# Patient Record
Sex: Male | Born: 1935 | Race: White | Hispanic: No | Smoking: Never smoker
Health system: Southern US, Community
[De-identification: ages and names within clinical notes are randomized; demographics above are authoritative.]

## PROBLEM LIST (undated history)

## (undated) DIAGNOSIS — N429 Disorder of prostate, unspecified: Secondary | ICD-10-CM

## (undated) DIAGNOSIS — M4800 Spinal stenosis, site unspecified: Secondary | ICD-10-CM

## (undated) DIAGNOSIS — H9190 Unspecified hearing loss, unspecified ear: Secondary | ICD-10-CM

## (undated) DIAGNOSIS — I10 Essential (primary) hypertension: Secondary | ICD-10-CM

## (undated) DIAGNOSIS — F32A Depression, unspecified: Secondary | ICD-10-CM

## (undated) DIAGNOSIS — E7800 Pure hypercholesterolemia, unspecified: Secondary | ICD-10-CM

## (undated) DIAGNOSIS — I2510 Atherosclerotic heart disease of native coronary artery without angina pectoris: Secondary | ICD-10-CM

## (undated) DIAGNOSIS — I4891 Unspecified atrial fibrillation: Secondary | ICD-10-CM

## (undated) DIAGNOSIS — N289 Disorder of kidney and ureter, unspecified: Secondary | ICD-10-CM

## (undated) DIAGNOSIS — N200 Calculus of kidney: Secondary | ICD-10-CM

## (undated) DIAGNOSIS — E079 Disorder of thyroid, unspecified: Secondary | ICD-10-CM

## (undated) DIAGNOSIS — F329 Major depressive disorder, single episode, unspecified: Secondary | ICD-10-CM

## (undated) HISTORY — PX: OTHER SURGICAL HISTORY: SHX169

## (undated) HISTORY — PX: TONSILLECTOMY: SUR1361

## (undated) HISTORY — PX: HERNIA REPAIR: SHX51

## (undated) HISTORY — PX: CARDIAC CATHETERIZATION: SHX172

## (undated) HISTORY — PX: CATARACT EXTRACTION: SUR2

## (undated) HISTORY — PX: CORONARY ARTERY BYPASS GRAFT: SHX141

## (undated) HISTORY — PX: CARPAL TUNNEL RELEASE: SHX101

## (undated) HISTORY — PX: PROSTATE SURGERY: SHX751

## (undated) HISTORY — PX: LUMBAR FUSION: SHX111

---

## 1998-06-28 ENCOUNTER — Ambulatory Visit (HOSPITAL_COMMUNITY): Admission: RE | Admit: 1998-06-28 | Discharge: 1998-06-28 | Payer: Self-pay | Admitting: *Deleted

## 2000-02-15 ENCOUNTER — Encounter: Payer: Self-pay | Admitting: Cardiovascular Disease

## 2000-02-16 ENCOUNTER — Inpatient Hospital Stay (HOSPITAL_COMMUNITY): Admission: RE | Admit: 2000-02-16 | Discharge: 2000-02-18 | Payer: Self-pay | Admitting: Cardiovascular Disease

## 2002-02-20 ENCOUNTER — Encounter: Admission: RE | Admit: 2002-02-20 | Discharge: 2002-02-20 | Payer: Self-pay | Admitting: Urology

## 2002-02-20 ENCOUNTER — Encounter: Payer: Self-pay | Admitting: Urology

## 2002-02-25 ENCOUNTER — Ambulatory Visit (HOSPITAL_BASED_OUTPATIENT_CLINIC_OR_DEPARTMENT_OTHER): Admission: RE | Admit: 2002-02-25 | Discharge: 2002-02-25 | Payer: Self-pay | Admitting: Urology

## 2002-02-25 ENCOUNTER — Encounter: Payer: Self-pay | Admitting: Urology

## 2002-02-28 ENCOUNTER — Inpatient Hospital Stay (HOSPITAL_COMMUNITY): Admission: EM | Admit: 2002-02-28 | Discharge: 2002-03-03 | Payer: Self-pay | Admitting: Emergency Medicine

## 2002-02-28 ENCOUNTER — Encounter: Payer: Self-pay | Admitting: Urology

## 2002-03-01 ENCOUNTER — Encounter: Payer: Self-pay | Admitting: Urology

## 2002-03-02 ENCOUNTER — Encounter: Payer: Self-pay | Admitting: Urology

## 2002-03-18 ENCOUNTER — Ambulatory Visit (HOSPITAL_COMMUNITY): Admission: RE | Admit: 2002-03-18 | Discharge: 2002-03-18 | Payer: Self-pay | Admitting: Urology

## 2002-03-18 ENCOUNTER — Encounter: Payer: Self-pay | Admitting: Urology

## 2002-03-23 ENCOUNTER — Encounter: Payer: Self-pay | Admitting: Urology

## 2002-03-23 ENCOUNTER — Inpatient Hospital Stay (HOSPITAL_COMMUNITY): Admission: RE | Admit: 2002-03-23 | Discharge: 2002-03-30 | Payer: Self-pay | Admitting: Urology

## 2002-03-24 ENCOUNTER — Encounter: Payer: Self-pay | Admitting: Urology

## 2002-03-24 ENCOUNTER — Encounter (INDEPENDENT_AMBULATORY_CARE_PROVIDER_SITE_OTHER): Payer: Self-pay

## 2002-06-04 ENCOUNTER — Ambulatory Visit (HOSPITAL_BASED_OUTPATIENT_CLINIC_OR_DEPARTMENT_OTHER): Admission: RE | Admit: 2002-06-04 | Discharge: 2002-06-04 | Payer: Self-pay | Admitting: Urology

## 2002-06-04 ENCOUNTER — Encounter: Payer: Self-pay | Admitting: Urology

## 2003-02-08 ENCOUNTER — Inpatient Hospital Stay (HOSPITAL_COMMUNITY): Admission: EM | Admit: 2003-02-08 | Discharge: 2003-02-20 | Payer: Self-pay | Admitting: Emergency Medicine

## 2003-02-08 ENCOUNTER — Encounter: Payer: Self-pay | Admitting: Emergency Medicine

## 2003-02-11 ENCOUNTER — Encounter: Payer: Self-pay | Admitting: Cardiothoracic Surgery

## 2003-02-12 ENCOUNTER — Encounter: Payer: Self-pay | Admitting: Cardiothoracic Surgery

## 2003-02-13 ENCOUNTER — Encounter: Payer: Self-pay | Admitting: Cardiothoracic Surgery

## 2003-02-14 ENCOUNTER — Encounter: Payer: Self-pay | Admitting: Cardiothoracic Surgery

## 2003-02-15 ENCOUNTER — Encounter: Payer: Self-pay | Admitting: Cardiothoracic Surgery

## 2004-07-17 ENCOUNTER — Ambulatory Visit (HOSPITAL_COMMUNITY): Admission: RE | Admit: 2004-07-17 | Discharge: 2004-07-17 | Payer: Self-pay | Admitting: Urology

## 2005-08-31 ENCOUNTER — Emergency Department (HOSPITAL_COMMUNITY): Admission: EM | Admit: 2005-08-31 | Discharge: 2005-09-01 | Payer: Self-pay | Admitting: Emergency Medicine

## 2005-08-31 ENCOUNTER — Emergency Department (HOSPITAL_COMMUNITY): Admission: EM | Admit: 2005-08-31 | Discharge: 2005-08-31 | Payer: Self-pay | Admitting: Emergency Medicine

## 2008-04-29 ENCOUNTER — Encounter: Admission: RE | Admit: 2008-04-29 | Discharge: 2008-04-29 | Payer: Self-pay | Admitting: Neurological Surgery

## 2008-06-24 ENCOUNTER — Inpatient Hospital Stay (HOSPITAL_COMMUNITY): Admission: RE | Admit: 2008-06-24 | Discharge: 2008-07-02 | Payer: Self-pay | Admitting: Neurological Surgery

## 2008-07-20 ENCOUNTER — Encounter: Admission: RE | Admit: 2008-07-20 | Discharge: 2008-07-20 | Payer: Self-pay | Admitting: Neurological Surgery

## 2008-07-26 ENCOUNTER — Encounter: Admission: RE | Admit: 2008-07-26 | Discharge: 2008-07-26 | Payer: Self-pay | Admitting: Neurological Surgery

## 2008-08-23 ENCOUNTER — Encounter: Admission: RE | Admit: 2008-08-23 | Discharge: 2008-08-23 | Payer: Self-pay | Admitting: Neurological Surgery

## 2008-10-25 ENCOUNTER — Encounter: Admission: RE | Admit: 2008-10-25 | Discharge: 2008-10-25 | Payer: Self-pay | Admitting: Neurological Surgery

## 2009-02-07 ENCOUNTER — Encounter: Admission: RE | Admit: 2009-02-07 | Discharge: 2009-02-07 | Payer: Self-pay | Admitting: Neurological Surgery

## 2009-05-19 ENCOUNTER — Ambulatory Visit (HOSPITAL_COMMUNITY): Admission: RE | Admit: 2009-05-19 | Discharge: 2009-05-19 | Payer: Self-pay | Admitting: Cardiovascular Disease

## 2009-11-06 IMAGING — RF DG LUMBAR SPINE 2-3V
1 series · 2 of 2 positions shown · non-contrast
Comparison: Lumbar spine CT 04/29/2008.

CLINICAL DATA: Bilateral L3-L4, L4-L5 PLIF.

LUMBAR SPINE - 2-3 VIEW

[Series 1: run · 2 of 2 slices shown]
[im 1/2]
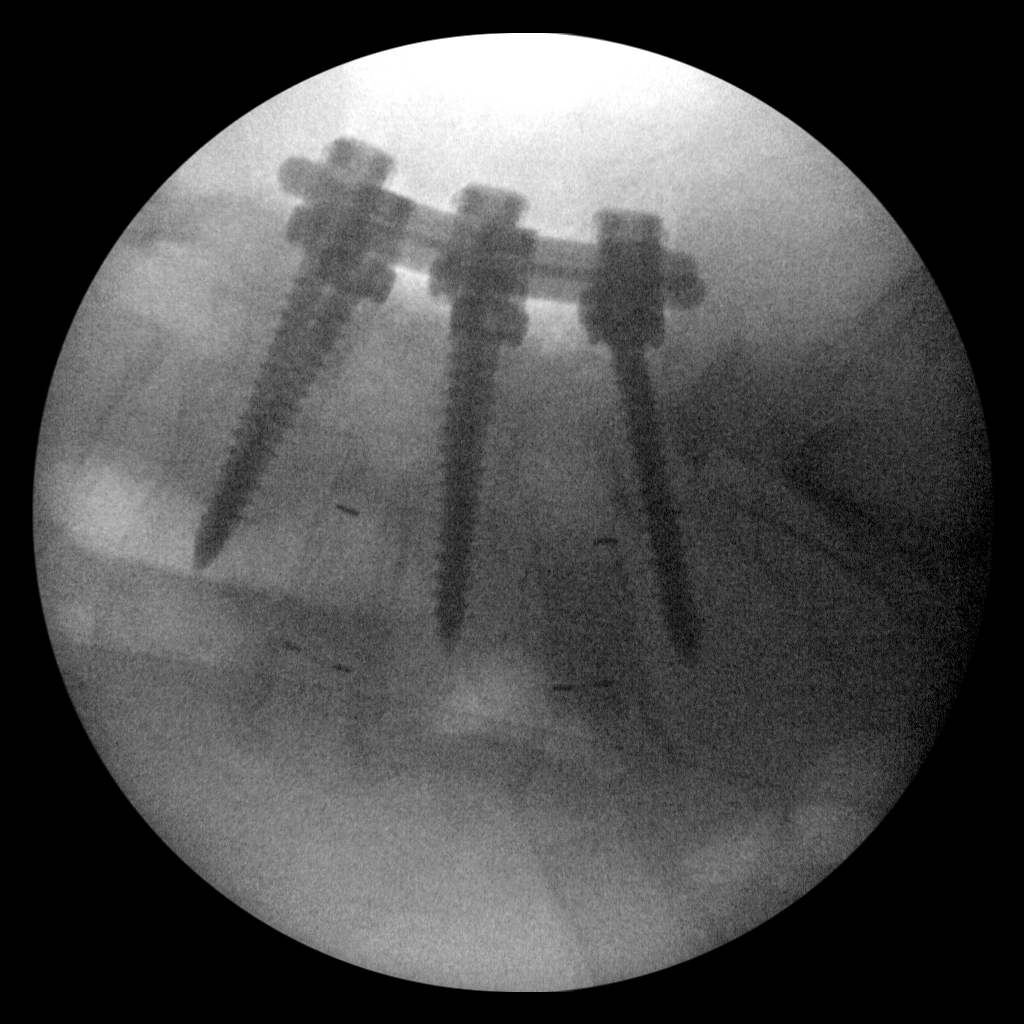
[im 2/2]
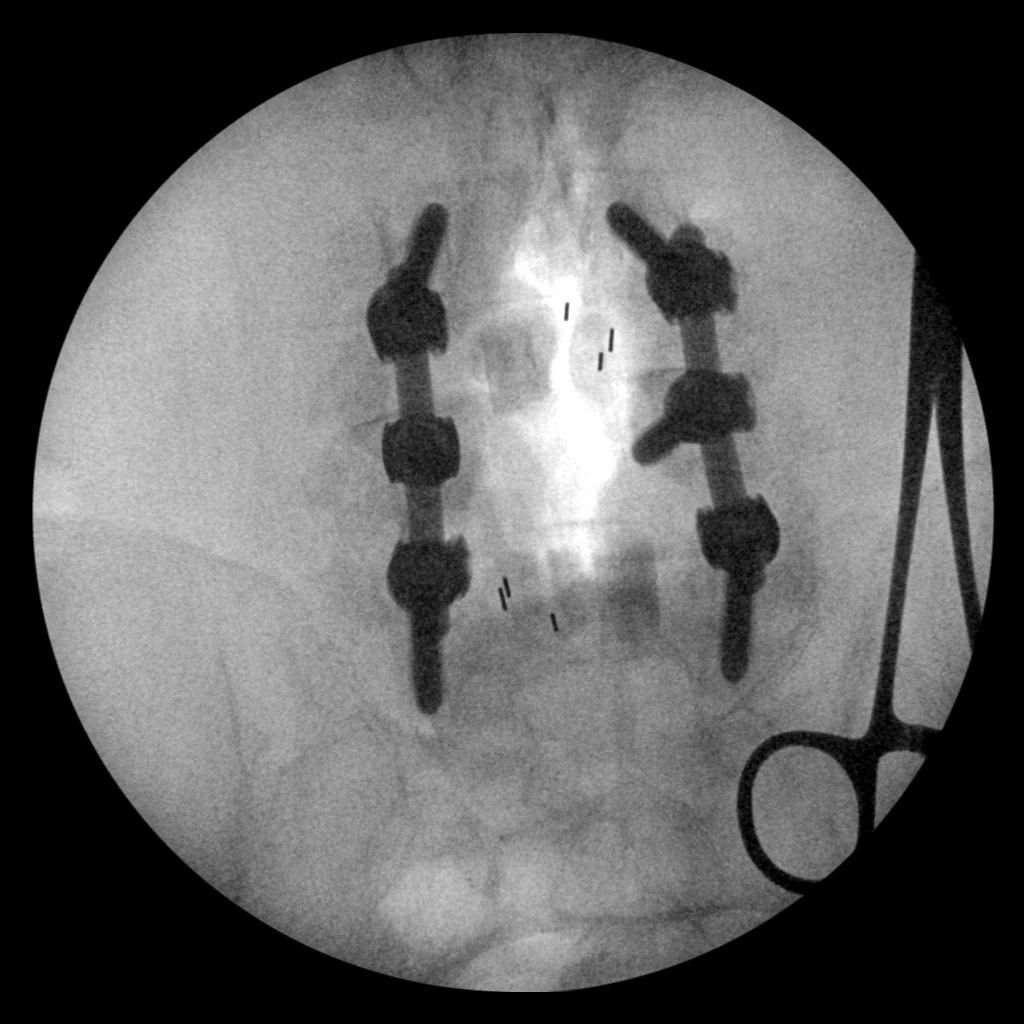

[2 of 2 positions shown; findings below may reference images not displayed]

FINDINGS: Spot fluoroscopic PA and lateral images are submitted
postoperatively from the operating room.  These demonstrate
bilateral laminectomies and posterior lumbar interbody fusion from
L3-L5.  Bilateral pedicle screws, interconnecting rods and
intervertebral bone plugs appear well positioned.
IMPRESSION: Intraoperative views during L3-L5 PLIF without demonstrated
complication.

## 2009-11-10 IMAGING — CR DG LUMBAR SPINE 2-3V
2 series · 2 of 2 positions shown · non-contrast
Comparison: 06/24/2008

CLINICAL DATA: Spinal stenosis

LUMBAR SPINE TWO VIEW

[t l-spine a.p.]
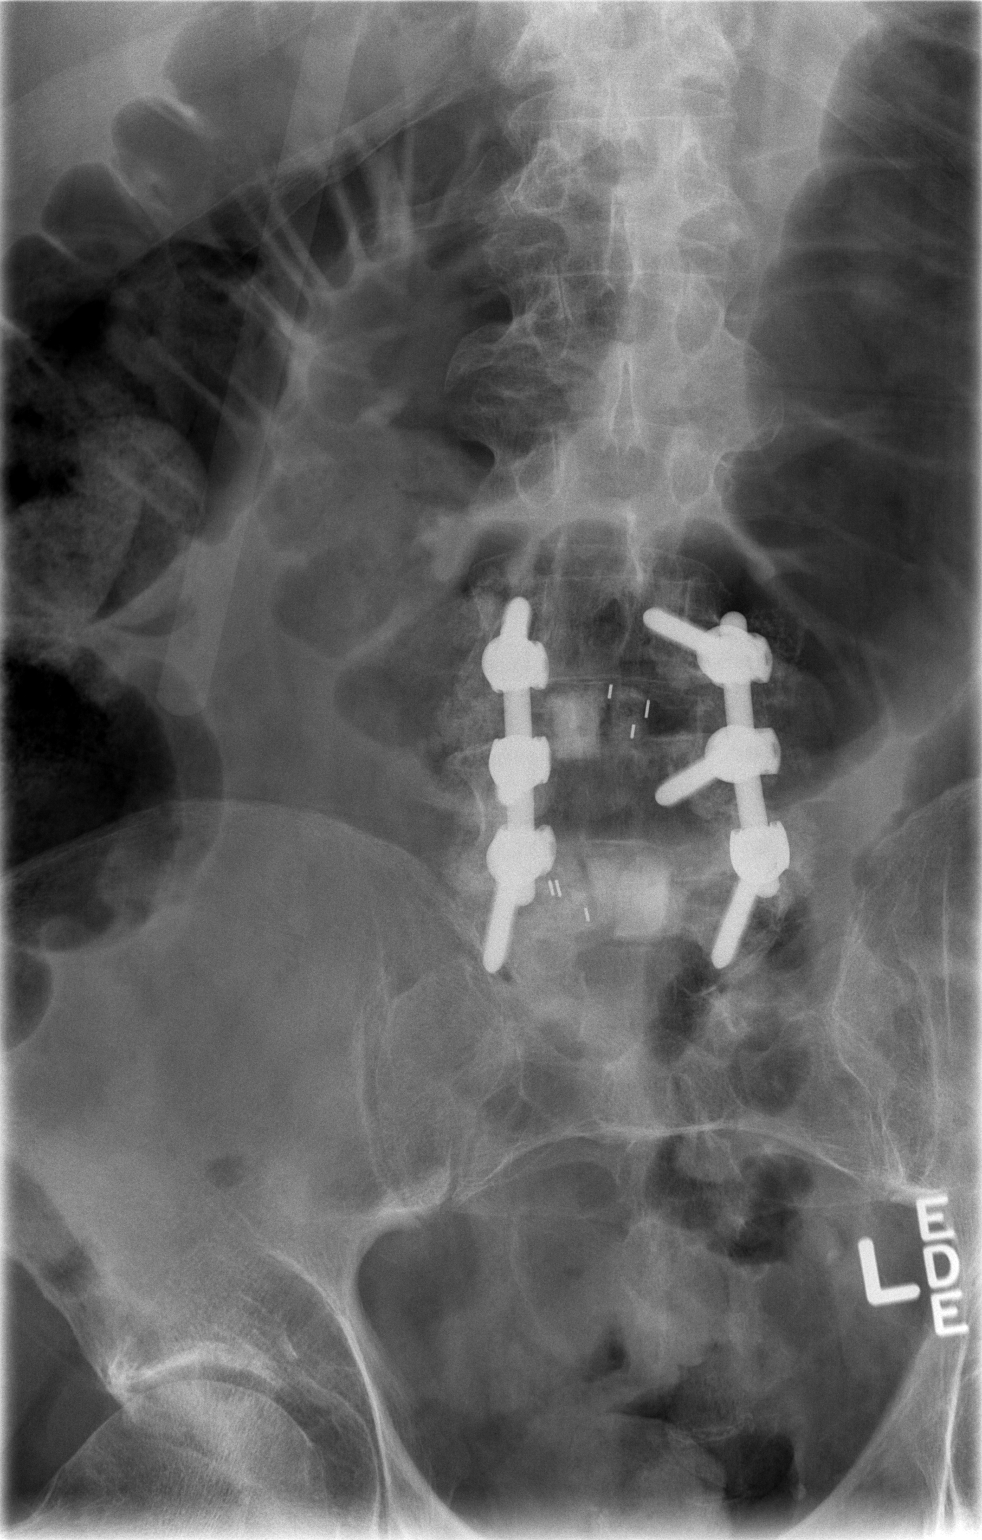

[t l-spine lat]
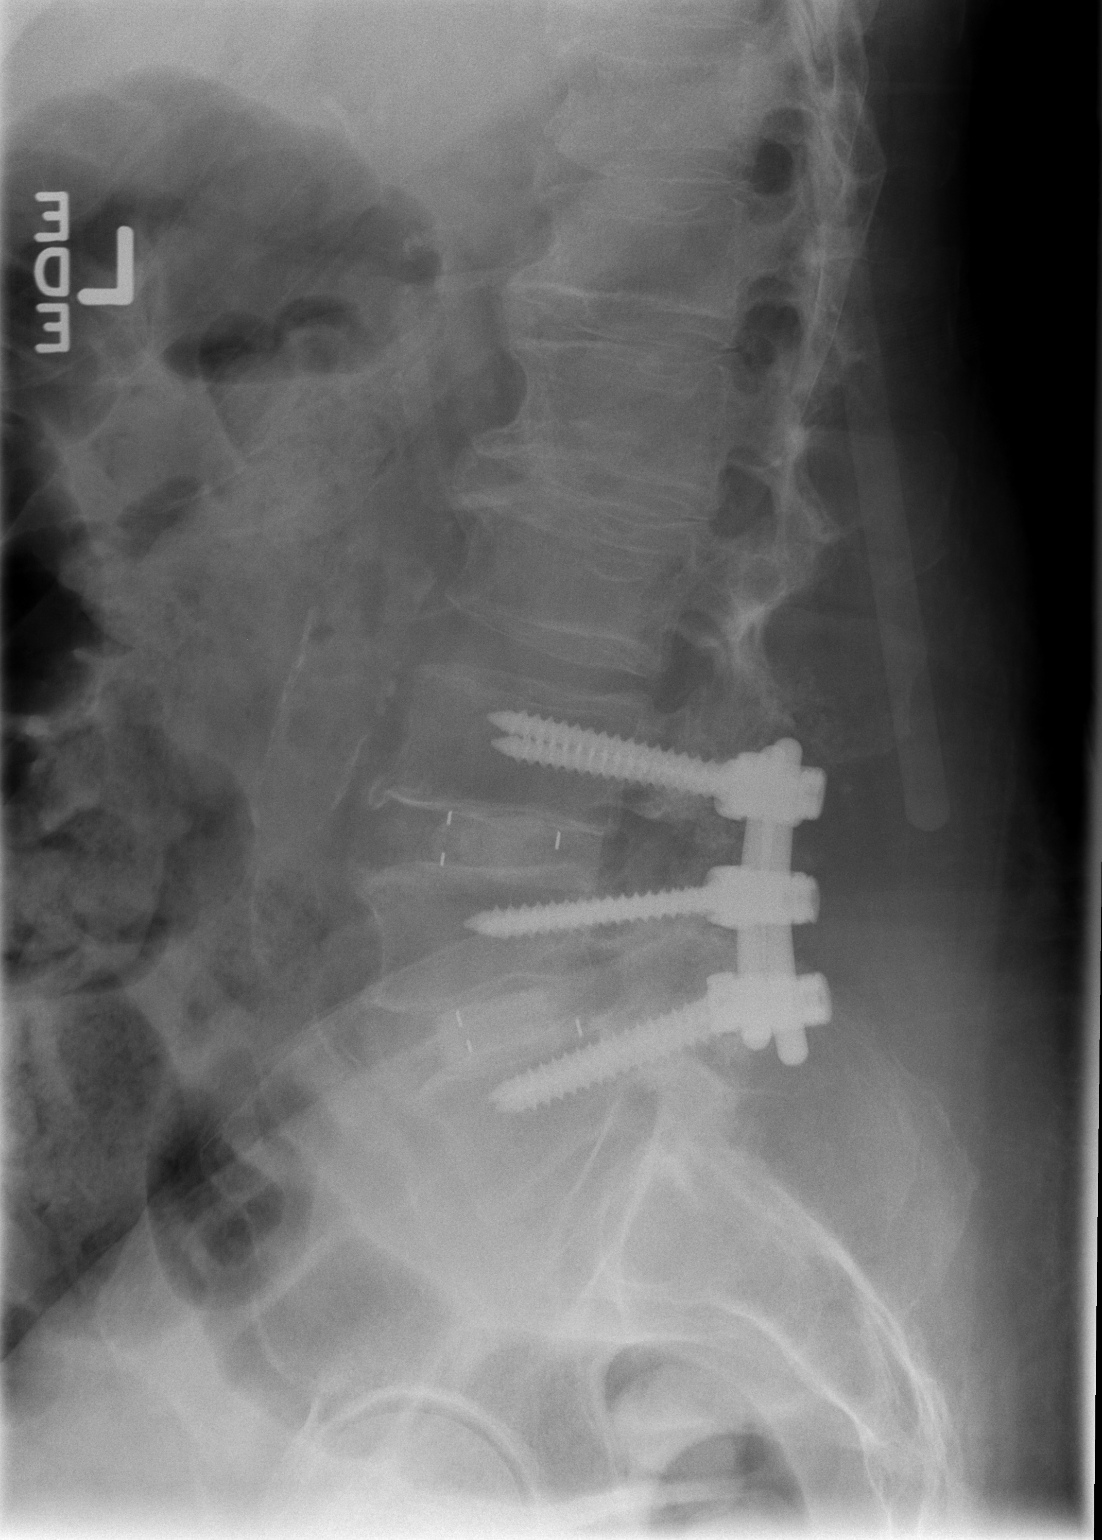

[2 of 2 positions shown; findings below may reference images not displayed]

FINDINGS: Changes of instrumented posterolateral and interbody
fusion L3-L5.  Bilateral pedicle screws at each level with
interconnecting vertical rods remain well positioned with no
surrounding lucency or fracture.  Graft material and markers in the
intervening interspaces are stable.  Prominent anterior endplate
spurring is noted T10-L2.  Atheromatous thoracic aorta.
IMPRESSION: Stable changes of PLIF L3-L5

## 2009-11-13 IMAGING — CR DG CHEST 2V
2 series · 2 of 2 positions shown · non-contrast
Comparison: PA and lateral chest 06/21/08 and [REDACTED].

CLINICAL DATA: Postoperative fever.

CHEST - 2 VIEW

[w chest pa]
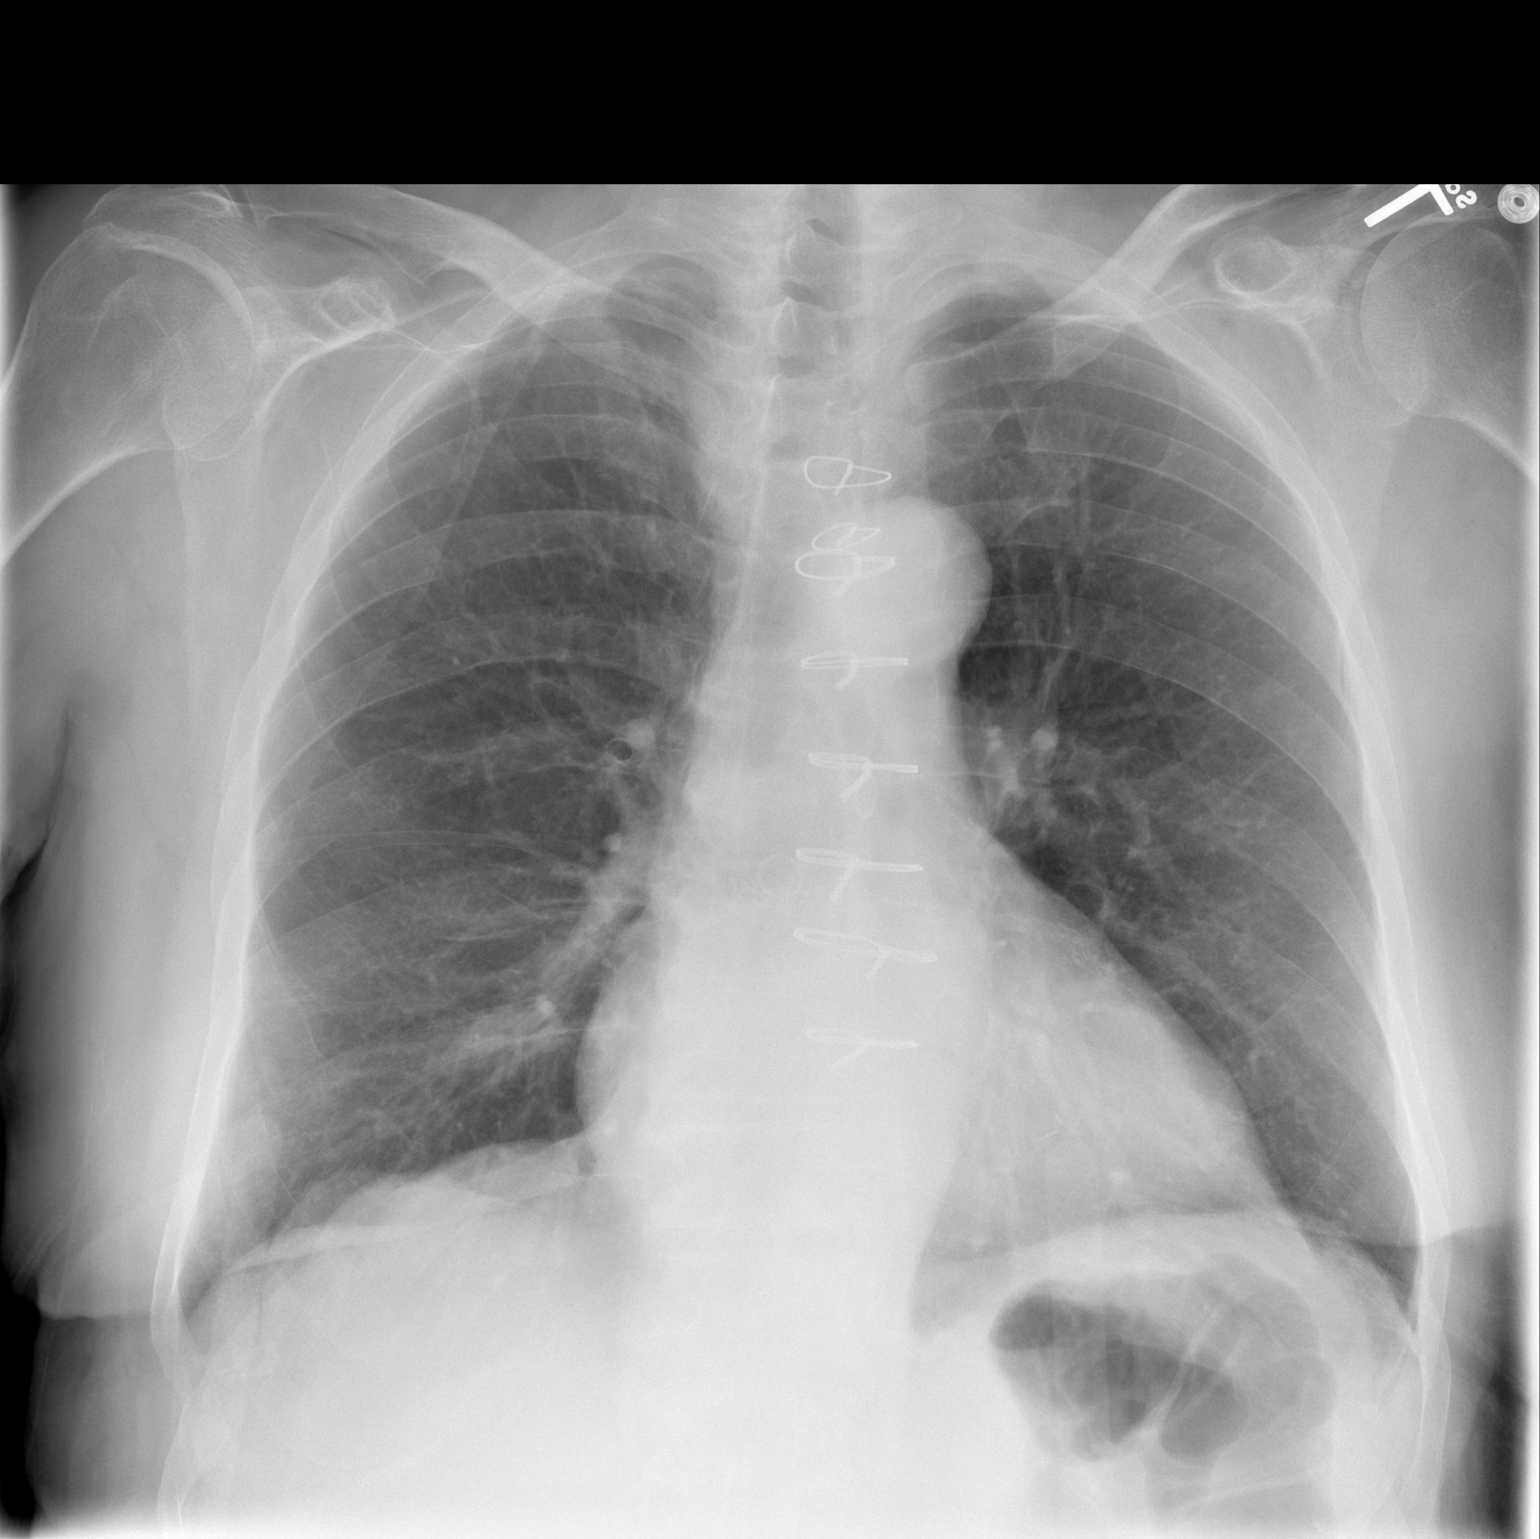

[w chest lat]
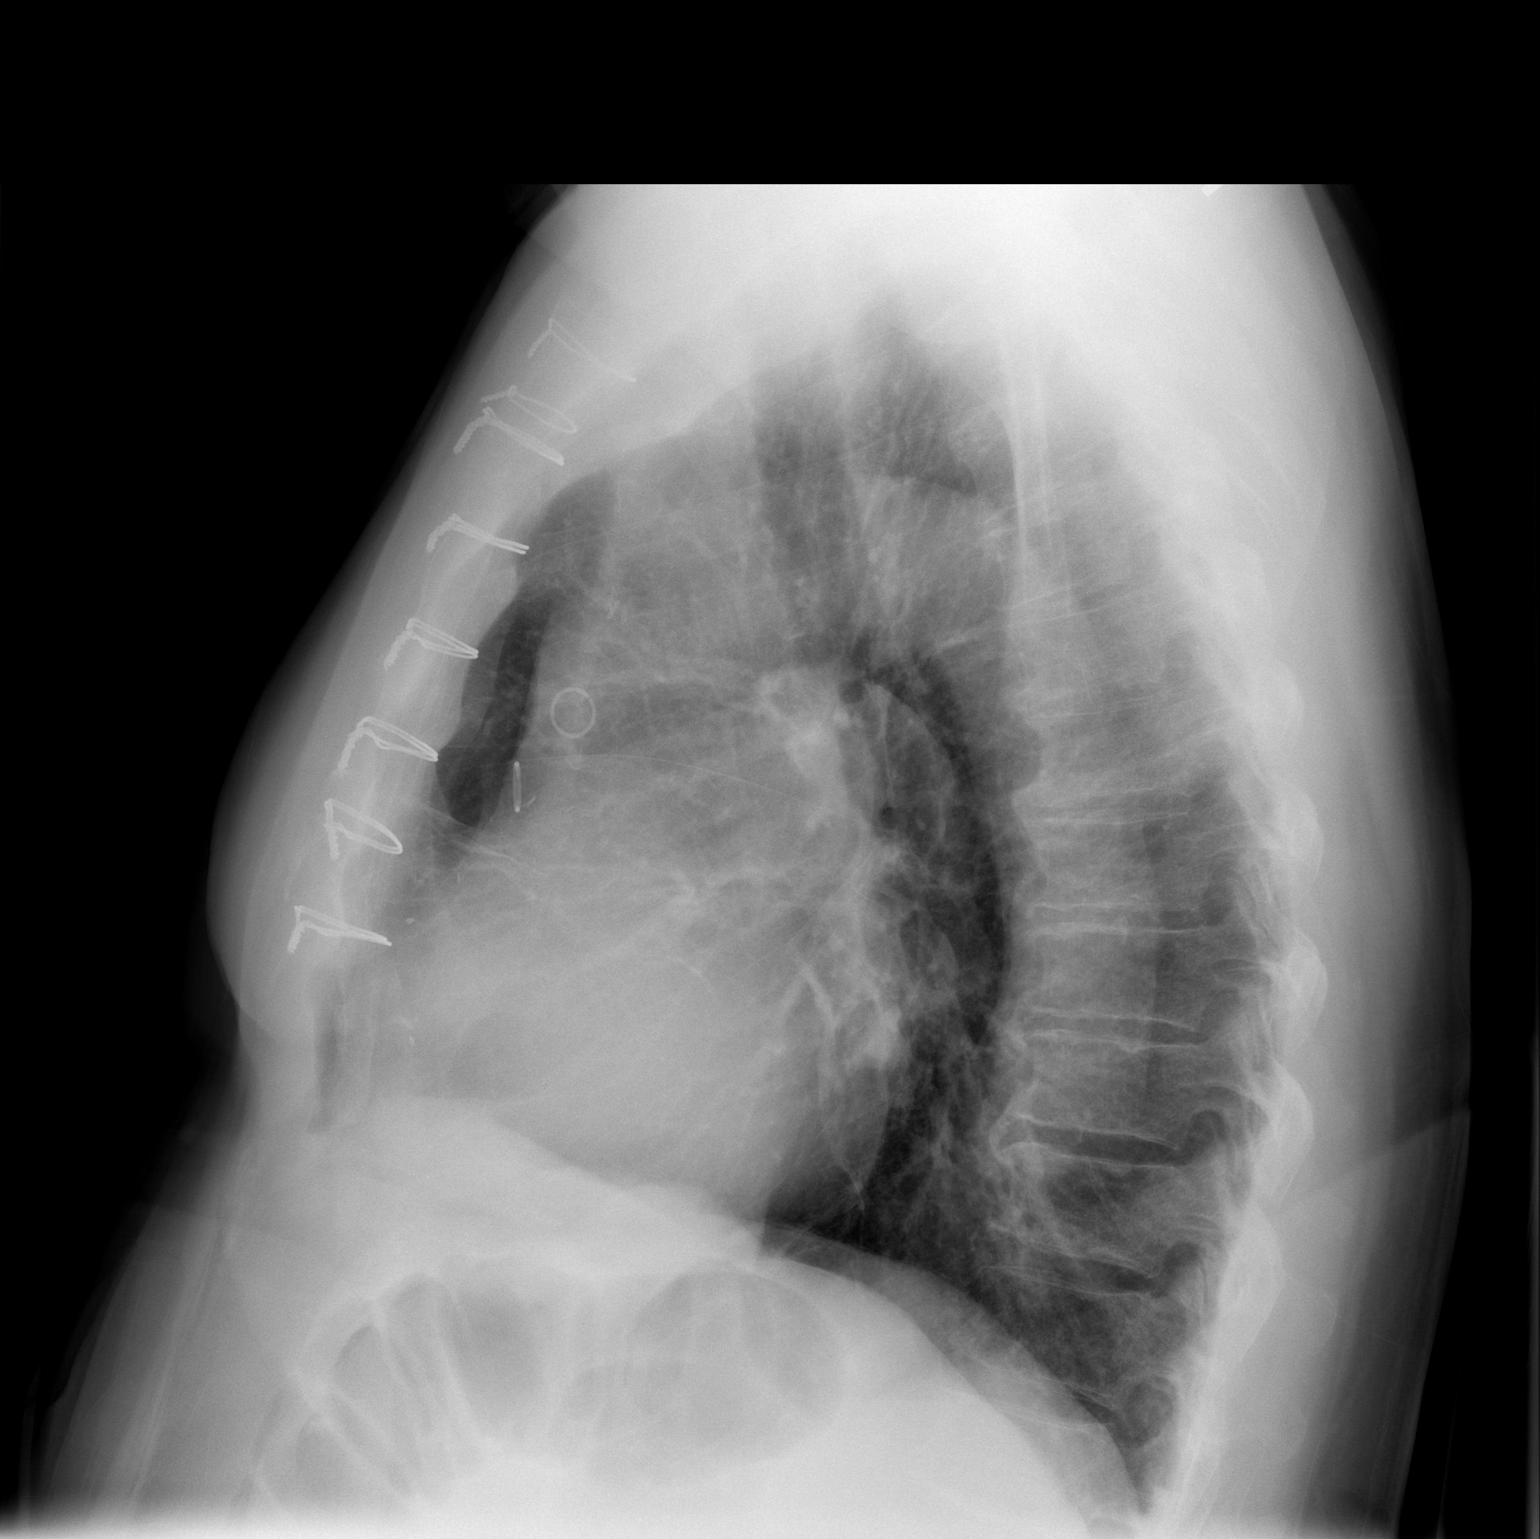

[2 of 2 positions shown; findings below may reference images not displayed]

FINDINGS: Lungs are clear.  There is cardiomegaly.  No pleural
effusion or focal bony abnormality.
IMPRESSION: Negative for pneumonia.  Cardiomegaly noted.

## 2010-09-19 LAB — CBC
HCT: 45 % (ref 39.0–52.0)
Platelets: 140 10*3/uL — ABNORMAL LOW (ref 150–400)
RBC: 4.48 MIL/uL (ref 4.22–5.81)
WBC: 6.2 10*3/uL (ref 4.0–10.5)

## 2010-09-19 LAB — BASIC METABOLIC PANEL
CO2: 29 mEq/L (ref 19–32)
Chloride: 103 mEq/L (ref 96–112)
GFR calc non Af Amer: 54 mL/min — ABNORMAL LOW (ref >60)
Potassium: 4.4 mEq/L (ref 3.5–5.1)

## 2010-09-19 LAB — TSH: TSH: 4.526 u[IU]/mL — ABNORMAL HIGH (ref 0.350–4.500)

## 2010-09-19 LAB — PROTIME-INR: INR: 1.84 — ABNORMAL HIGH (ref 0.00–1.49)

## 2010-09-19 LAB — APTT: aPTT: 32 seconds (ref 24–37)

## 2010-09-19 LAB — SEDIMENTATION RATE: Sed Rate: 2 mm/hr (ref 0–16)

## 2010-10-02 LAB — URINE MICROSCOPIC-ADD ON

## 2010-10-02 LAB — PROTIME-INR
INR: 1.1 (ref 0.00–1.49)
INR: 1.2 (ref 0.00–1.49)
INR: 1.3 (ref 0.00–1.49)
INR: 1.3 (ref 0.00–1.49)
Prothrombin Time: 15.9 seconds — ABNORMAL HIGH (ref 11.6–15.2)
Prothrombin Time: 16.7 seconds — ABNORMAL HIGH (ref 11.6–15.2)

## 2010-10-02 LAB — CBC
HCT: 41.9 % (ref 39.0–52.0)
Hemoglobin: 11.2 g/dL — ABNORMAL LOW (ref 13.0–17.0)
Hemoglobin: 11.5 g/dL — ABNORMAL LOW (ref 13.0–17.0)
Hemoglobin: 11.9 g/dL — ABNORMAL LOW (ref 13.0–17.0)
Hemoglobin: 14.5 g/dL (ref 13.0–17.0)
MCHC: 34 g/dL (ref 30.0–36.0)
MCHC: 34.5 g/dL (ref 30.0–36.0)
MCV: 95.6 fL (ref 78.0–100.0)
MCV: 96.2 fL (ref 78.0–100.0)
MCV: 96.7 fL (ref 78.0–100.0)
Platelets: 145 10*3/uL — ABNORMAL LOW (ref 150–400)
RBC: 3.5 MIL/uL — ABNORMAL LOW (ref 4.22–5.81)
RBC: 3.65 MIL/uL — ABNORMAL LOW (ref 4.22–5.81)
RBC: 3.72 MIL/uL — ABNORMAL LOW (ref 4.22–5.81)
RBC: 4.38 MIL/uL (ref 4.22–5.81)
RDW: 13.3 % (ref 11.5–15.5)
WBC: 11.7 10*3/uL — ABNORMAL HIGH (ref 4.0–10.5)
WBC: 7.4 10*3/uL (ref 4.0–10.5)
WBC: 8.6 10*3/uL (ref 4.0–10.5)

## 2010-10-02 LAB — TYPE AND SCREEN: Antibody Screen: NEGATIVE

## 2010-10-02 LAB — DIFFERENTIAL
Eosinophils Absolute: 0.2 10*3/uL (ref 0.0–0.7)
Eosinophils Relative: 2 % (ref 0–5)
Lymphs Abs: 1.2 10*3/uL (ref 0.7–4.0)
Monocytes Absolute: 1 10*3/uL (ref 0.1–1.0)
Monocytes Relative: 14 % — ABNORMAL HIGH (ref 3–12)
Neutrophils Relative %: 68 % (ref 43–77)

## 2010-10-02 LAB — BASIC METABOLIC PANEL
Calcium: 8.5 mg/dL (ref 8.4–10.5)
Chloride: 100 mEq/L (ref 96–112)
Chloride: 100 mEq/L (ref 96–112)
Creatinine, Ser: 0.95 mg/dL (ref 0.4–1.5)
Creatinine, Ser: 1.16 mg/dL (ref 0.4–1.5)
GFR calc Af Amer: >60 mL/min (ref >60)
GFR calc Af Amer: >60 mL/min (ref >60)
GFR calc Af Amer: >60 mL/min (ref >60)
GFR calc non Af Amer: >60 mL/min (ref >60)
Potassium: 3.7 mEq/L (ref 3.5–5.1)
Potassium: 4.5 mEq/L (ref 3.5–5.1)

## 2010-10-02 LAB — URINALYSIS, ROUTINE W REFLEX MICROSCOPIC
Glucose, UA: NEGATIVE mg/dL
Specific Gravity, Urine: 1.028 (ref 1.005–1.030)
pH: 7 (ref 5.0–8.0)

## 2010-10-02 LAB — ABO/RH: ABO/RH(D): O NEG

## 2010-10-02 LAB — URINE CULTURE: Culture: NO GROWTH

## 2010-10-31 NOTE — Consult Note (Signed)
Kirk Miller, Kirk Miller NO.:  [id number]   MEDICAL RECORD NO.:  [id number]          PATIENT TYPE:  INP   LOCATION:  3028                         FACILITY:  [facility]   PHYSICIAN:  Maretta Bees. Vonita Moss, M.D.DATE OF BIRTH:  1924/01/19   DATE OF CONSULTATION:  06/28/2008  DATE OF DISCHARGE:                                 CONSULTATION   I was asked to see this gentleman by Dr. Gerlene Fee because he had recent  back surgery and Foley catheter was put at the time of surgery.  It was  removed and then he voided satisfactorily for a while, but then  developed increased trouble voiding.  A bladder scan showed over 1000 mL  in the bladder and nursing staff could not catheterize him.   He is a patient of Dr. Vic Blackbird and said that there sometimes  have been troubles catheterizing him before.  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  He said ordinarily, he voids well  per routine office visits with Dr. Aldean Ast.   This gentleman's medical problems include:  1. Atrial fibrillation.  2. Hypercholesterolemia.  3. Gastroesophageal reflux.  4. Hypothyroidism.   He also is on Proscar for his prostate.   He is allergic to SULFA, PENICILLIN, and MOBIC.   PHYSICAL EXAMINATION:  GENERAL:  On examination, he is alert and  oriented in minimal distress.  ABDOMEN:  Soft and nontender except for suprapubic fullness.  GU:  Penis, urethral meatus, scrotum, testicles, and epididymis without  lesions.   Foley catheter was inserted without particular difficulty and several  hundred mL of clear urine obtained per Foley.   IMPRESSION:  1. Bladder outlet obstruction.  2. Urinary retention.  3. History of stones.   PLAN:  Leave Foley catheter in place and continue on Proscar and I have  started him on Flomax and Dr. Aldean Ast will see him in followup  regarding timing of a voiding trial.      Maretta Bees. Vonita Moss, M.D.  Electronically Signed     LJP/MEDQ   D:  06/28/2008  T:  06/28/2008  Job:  [id number]   cc:   Reinaldo Meeker, M.D.

## 2010-10-31 NOTE — Discharge Summary (Signed)
NAMEJUSTAN, Kirk Miller NO.:  [id number]   MEDICAL RECORD NO.:  [id number]          PATIENT TYPE:  INP   LOCATION:  3028                         FACILITY:  [facility]   PHYSICIAN:  Tia Alert, MD     DATE OF BIRTH:  1923-11-29   DATE OF ADMISSION:  06/24/2008  DATE OF DISCHARGE:  07/02/2008                               DISCHARGE SUMMARY   ADMISSION DIAGNOSIS:  Lumbar spinal stenosis.   PROCEDURE:  Decompression lumbar laminectomy with instrumented fusion L3-  L5.   HISTORY OF PRESENT ILLNESS:  Mr. Fier is an 77 year old gentleman who  presented with severe back and leg pain.  He had an MRI which showed  severe stenosis at L3-L4, L4-L5 and recommended a decompression  instrumented fusion from L3-L5.  He understood the risks, benefits,  expected outcome and wished to proceed.   HOSPITAL COURSE:  The patient was admitted on June 24, 2008 and taken  to operating room where he underwent a lumbar decompression instrumented  fusion L3-L5.  He tolerated the procedure well.  He was taken to  recovery room and then to the neurosurgical ICU in stable condition.  He  did quite well here.  He was started on Lovenox.  He was on Coumadin  prior to the surgery.  This was resumed on postop day 2.  He was moved  out of the ICU on postop day 1 where he mobilized fairly well with  physical and occupational therapy.  On postop day 3, he had some urinary  retention and had a Foley catheter placed.  Urinalysis did not show  urinary tract infection.  He had some increase in his pain at that time.  Plain films looked quite good.  He continued on his Coumadin as per the  pharmacy.  He remained afebrile with stable vital signs.  His incision  remained clean, dry and intact.  He had weakness in his dorsiflexors on  the left, but that was old.  He then started to improve with physical  and occupational therapy, and was more mobile.  We felt he was stable  for discharge to a skilled  nursing facility and he was discharged to  [facility] in [place] with plans to follow up with  me 1 week after discharge from that facility.   DISCHARGE MEDICATIONS:  1. Colace 100 mg p.o. b.i.d.  2. Levothyroxine 137 mcg p.o. daily.  3. Protonix 40 mg p.o. daily.  4. Amiodarone 200 mg p.o. daily.  5. Aspirin 81 mg p.o. daily.  6. Calcium carbonate 500 mg p.o. b.i.d.  7. Toprol XL 25 mg p.o. daily.  8. Proscar 5 mg n.p.o. daily.  9. Lexapro 10 mg p.o. daily.  10.Zocor 40 mg p.o. daily.  11.Coumadin 5 mg p.o. daily.  12.Flomax 0.4 mg p.o. daily.  13.Percocet 1-2 pills p.o. q.6 h. p.r.n. pain.  14.Flexeril 10 mg p.o. q.8 h. p.r.n. spasm.   PLAN:  1. The patient's primary care physician, Dr. Aldean Ast would like to      be responsible for removal of the Foley  catheter followed by a voiding trial according to his note in the      chart.  2. I would like to see the patient back in followup 1 week after      discharge from the skilled nursing facility.   FINAL DIAGNOSIS:  Post lumbar interbody fusion L3-L4, L4-L5.      Tia Alert, MD  Electronically Signed     DSJ/MEDQ  D:  07/02/2008  T:  07/02/2008  Job:  [id number]

## 2010-11-03 NOTE — H&P (Signed)
NAMELI, FRAGOSO NO.:  [id number]   MEDICAL RECORD NO.:  [id number]                    PATIENT TYPE:   LOCATION:                                       FACILITY:   PHYSICIAN:  Jamison Neighbor, M.D.               DATE OF BIRTH:   DATE OF ADMISSION:  DATE OF DISCHARGE:                                HISTORY & PHYSICAL   ADMISSION DIAGNOSIS:  Obstructive right kidney.   HISTORY OF PRESENT ILLNESS:  This 77 year old male has obstruction of the  right kidney, felt to be due to ureteral scarring secondary to a urethral  lithotomy done back in the 1970's. The patient developed increasing flank  pain to the point where he was taken to the OR and attempt at double J  catheter placement was made. Dr. Aldean Ast was able to get a wire through  the strictured area but apparently could not get a balloon dilator or Stent  through that area. This procedure was done this past Wednesday. On Thursday  of last week, the patient had an IVP performed in Dr. Valda Lamb office  which apparently also showed obstruction. The patient and his wife relate  that they were told that a percutaneous nephrostomy or other form of  diversion might be required but they were unclear as to when this would be  scheduled. The patient developed increasing pain to the point where he  presented to the emergency room in the early hours of Saturday morning. He  was brought in on Saturday for 23 hour observation, so that a percutaneous  nephrostomy tube could be placed. The patient's pain was reasonably well  controlled with a PCA pump. Radiology, however, could not get to the  nephrostomy tube placement until Sunday and apparently the patient is being  converted from 23 hour observation status to admission status.   PAST MEDICAL HISTORY:  Remarkable for hyperthyroidism, coronary artery  disease.   CURRENT MEDICATIONS:  Altace 5 mg daily, Lipitor 10 mg daily, Synthroid 50  mcg daily, and  he takes Celebrex one daily, although that has been placed on  hold. The patient also was recently given Roxicet for pain and Levaquin  following his recent cystoscopic procedure.   PAST SURGICAL HISTORY:  Kidney stone surgery in 1977 with wound infection  complications. He has had hernia repairs times two. He has had PTCA and  Stent placement in 2001 by Dr. [name]. The patient does not use tobacco  or alcohol.   REVIEW OF SYSTEMS:  Unremarkable other than as described above.   PHYSICAL EXAMINATION:  GENERAL: Very friendly male who does have severe  right sided flank pain.  VITAL SIGNS: Temperature 97.7, pulse 60, respiratory rate 18, blood pressure  172/95. This did decrease once the pain was placed under control.  HEENT: Normocephalic and atraumatic. Cranial nerves 2-12 grossly intact.  NECK: Supple. No adenopathy or thyromegaly.  LUNGS: Clear.  HEART: Regular rate and rhythm.  No murmur, rub, or gallop.  ABDOMEN: Soft and nontender with no palpable masses, rebound, or guarding.  No hepatosplenomegaly detected. No flank mass or tenderness.  EXTREMITIES: No clubbing, cyanosis, or edema.  RECTAL: Examination not performed.    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   PLAN:  Admit in preparation for percutaneous nephrostomy tube placement.                                               Jamison Neighbor, M.D.    RJE/MEDQ  D:  03/01/2002  T:  03/01/2002  Job:  [id number]   cc:   Rozanna Boer., M.D.   Lucila Maine, M.D.

## 2010-11-03 NOTE — Cardiovascular Report (Signed)
Kirk Miller Miller, Kirk Miller NO.:  [id number]   MEDICAL RECORD NO.:  [id number]                   PATIENT TYPE:  INP   LOCATION:  3737                                 FACILITY:  [facility]   PHYSICIAN:  Darlin Priestly, M.D.             DATE OF BIRTH:  1924-06-06   DATE OF PROCEDURE:  02/09/2003  DATE OF DISCHARGE:                              CARDIAC CATHETERIZATION   PROCEDURES:  1. Left heart catheterization.  2. Coronary angiography.  3. Left ventriculogram.  4. Abdominal aortogram.   ATTENDING PHYSICIAN:  Darlin Priestly, M.D.   COMPLICATIONS:  None.   INDICATIONS:  Kirk Miller Miller is a 77 year old male patient of Dr. Pearletha Furl.  Kirk Miller Miller and Dr. Aldean Ast with history of hypertension, hyperlipidemia,  history of coronary artery disease status post percutaneous transluminal  coronary angioplasty and stenting of his LAD in August 2001.  The patient  was admitted on February 08, 2003 with substernal chest pain radiating to his  shoulder and diaphoresis.  He is now referred for repeat catheterization to  rule out significant coronary artery disease.   DESCRIPTION OF OPERATION:  After obtaining informed written consent, the  patient was brought to the cardiac catheterization laboratory.  Right groin  was shaved and prepped and draped in the usual sterile fashion.  ECG monitor  was established.  Using the modified Seldinger technique, a 6 French  arterial sheath was inserted in the right femoral artery. A 6 French  diagnostic catheter was then used to performed diagnostic angiography.  This  reveals a large left main with no significant disease.  The LAD is a large  vessel that coursed to the apex and gives rise to three diagonal branches.  The LAD has noted 40% ostial narrowing.  There is a stent noted in the  proximal and mid LAD extending across two diagonal branches.  There is  diffuse 10-20% in-stent restenosis.  The mid LAD is noted to have a 60%  lucent area at the take-off of the third diagonal.  There is 50% distal LAD  lesion.   First, second and third diagonals are all small vessels with ostial disease.   Left circumflex was a large vessel that coursed through the AV groove and  gave rise to two obtuse marginal branches.  The AV groove circ is noted to  have a 70% lesion at the take-off at the large second obtuse marginal.  The  first OM is a large vessel that bifurcates distally and has an 80% early mid  vessel lesion.  The second OM is a large vessel with no significant disease.   The right coronary artery is a medium size vessel which is dominant and  gives rise to both PDA as well as posterior lateral branch.  The RCA is  noted to have a 70% lucent area in its mid segment.  The PDA is a medium  size vessel with  95% ostial and proximal  lesion.  The PLA is a medium size  vessel with 80% ostial lesion.   LEFT VENTRICULOGRAM:  Left ventriculogram reveals mildly depressed EF at 40-  45% with inferoapical and apical hypokinesis.   ABDOMINAL AORTOGRAM:  Reveals mildly dilated aorta just above the renals.  There does not appear to be any significant renal artery stenosis.   HEMODYNAMICS:  Systemic arterial pressure 119/83, LV pressure 120/10, LVEDP  of 12.   CONCLUSIONS:  1. Significant three-vessel coronary artery disease.  2. Mildly depressed left ventricular systolic function.  3. No evidence of significant abdominal aneurysm.                                                   Darlin Priestly, M.D.    RHM/MEDQ  D:  02/09/2003  T:  02/09/2003  Job:  [id number]   cc:   Rozanna Boer., M.D.  [address]  [place]  [place] [address]  Fax: [phone]

## 2010-11-03 NOTE — Discharge Summary (Signed)
NAMESEYDOU, HEARNS NO.:  [id number]   MEDICAL RECORD NO.:  [id number]                   PATIENT TYPE:  INP   LOCATION:  0348                                 FACILITY:  [facility]   PHYSICIAN:  Rozanna Boer., M.D.      DATE OF BIRTH:  1923/10/21   DATE OF ADMISSION:  03/23/2002  DATE OF DISCHARGE:  03/30/2002                                 DISCHARGE SUMMARY   DISCHARGE DIAGNOSES:  1. Right proximal ureteral stricture.  2. Hydronephrosis.  3. Left renal stone.  4. Status post cardiac stent.   PROCEDURE:  1. Attempted antegrade ureteral stricture dilation on 03/23/02.  2. Right ureteral pyelostomy on 03/24/02.   HISTORY OF PRESENT ILLNESS:  This 77 year old patient is admitted with a  right proximal ureteral stricture from an old ureteral lithotomy in 1977,  with postoperative infection.  He had acute flank pain on 02/17/02, eventually  revealed the stricture.  He had a guide wire through the stricture, but I  could not advance a #6 ureteral catheter over this on 02/25/02.  He was  admitted for a percutaneous nephrostomy on 03/01/02, and attempted antegrade  dilation was not successful on 03/02/02.  He is re-admitted at this time for  another attempt at antegrade dilation on the day of admission, but will have  open exploration the following day if unsuccessful.  He did have equal  function on renal scan on 03/18/02, and has a 7.7 x 5 mm stone in the left  mid pole kidney since 1989, so a nephrectomy would be possible, but a very  undesirable option.  He is admitted on Altace, Isosorbide, Levaquin,  Lipitor, and Synthroid.  He has been off his aspirin and Plavix for at least  a week.   ALLERGIES:  1. PENICILLIN.  2. SULFA.   LABORATORY DATA:  Admission hematocrit of 44%, white count 5900, creatinine  was 1.1, and remained stable throughout.   HOSPITAL COURSE:  He was taken to the operating room the following day after  admission after  unsuccessful antegrade attempt at dilation through his  percutaneous nephrostomy.  At exploration he had a very dense scar  encompassing the stricture and also a kink at the ureteropelvic junction.  He ended up having a segment of about 2 inches taken out, and then the  kidney was immobilized and packed so that it could come together with the  ureteral pyelostomy.  This was done over a stent.  Intraoperatively, he  developed atrial fibrillation which was new.  His pressure dropped when his  rate was over 137, so a Cardizem drip had to be started.  This was  maintained postoperatively for a while.  He was put in the ICU, and then  seen by cardiology.  Dr. Kandis Cocking group felt that he ought to be  anticoagulated, so he was put on heparin protocol for the first night  postoperatively.  He had a drain and did  not seem to drain.  His hematocrit  remained stable, but within about four or five hours postoperatively, his  atrial fibrillation converted back to normal sinus rhythm.  His hematocrit  remained stable in the mid to low 30's, and his white count was also normal.  He was transferred out of the unit up to the floor, and his JP drainage  continued for a while.  He was taken off the heparin after the AF  reconverted, and was taken off the Cardizem drip at that time.  He had a  little chest pain on the third postoperative day, but this was felt to be  probably non-cardiac in origin, probably due to gas.  He was ambulated and  finally his JP drainage came out on the sixth postoperative day.  He did go  into urinary retention on the fourth postoperative day, had to have a  catheter, but on Flomax he was able to have a successful voiding trial a  couple of days later.  He is having bowel movement, on a regular diet, his  drain was out, his wound looked good, Steri-Strips were removed after  removing the staples on the seventh postoperative day, and he was discharged  home on Cipro 500 mg p.o.  b.i.d. x1 week.  He was given Flomax 0.4 mg q.d.,  plus his other home medications which included Altace, Isosorbide, Lipitor,  and Synthroid.  He will return to see me in a week.   ACTIVITY:  Light.   CONDITION ON DISCHARGE:  Discharge in an afebrile condition.                                                 Rozanna Boer., M.D.    HMK/MEDQ  D:  03/30/2002  T:  03/30/2002  Job:  [id number]

## 2010-11-03 NOTE — Op Note (Signed)
[place]. [facility]  Patient:    Kirk Miller, Kirk Miller                      MRN: [id number] Proc. Date: 02/15/00 Adm. Date:  [id number] Attending:  Ruta Hinds CC:         Lucila Maine, M.D., [facility], [place], [place]   Operative Report  PROCEDURES:  1. Retrograde central aortic catheterization.  2. Selective coronary angiography.  3. ______ intracoronary nitroglycerin administration.  4. Left ventricular angiogram in right anterior oblique and left anterior     oblique projections.  5. Aortic root angiogram in left anterior oblique projection.  6. Abdominal aortic angiogram in midstream posteroapical projection.  7. Selective left renal artery angiogram in posteroapical projection hand     injection.  8. Weight adjusted heparin.  9. Aggrastat bolus plus infusion. 10. Plavix p.o. 11. Left anterior descending percutaneous coronary intervention with tandem     proximal-mid left anterior descending stenting for high-grade stenosis     with disrupted plaque. 12. Percutaneous transluminal coronary angioplasty of side branch second     diagonal. 13. Intracoronary nitroglycerin administration.  DESCRIPTION OF PROCEDURE:  The patient was brought to the second floor CP lab in a post absorptive state after 5 mg of Valium p.o. premedication.  He was given 150 mg of Plavix preoperatively and had been on one aspirin a day as an outpatient.  The right groin was prepped and draped in the usual manner.  One percent Xylocaine was used for local anesthesia.  The RFA was entered with a single anterior puncture using an 18 thin-wall needle with modified Seldinger technique.  A 6 French short Cordis side arm sheath was inserted without difficulty and diagnostic angiography was done with 6 [place] 4 cm taper, preformed coronary, and pigtail catheters followed by LV angiogram in the RAO and LAO projection at 25 cc and 14 cc/sec and 20 cc and 12 cc/sec.   Pullback pressure CA was performed and showed no gradient across the aortic valve.  An aortic root angiogram was done in the LAO projection at 30 cc and 18 cc/sec and a second injection at 20 cc/sec above the aortic valve.  Abdominal aortic angiogram was done at 30 cc and 20 cc/sec above the renal arteries in the midsteam PA projection.  Selective right renal angiogram was done by hand injection with a 6 French right coronary catheter.  PRESSURES:  LV:  175/0; LVEDP 18 mmHg.  CA:  175/80 mmHg.  There is no gradient across the aortic valve on catheter pullback.  There is no intracardic or valvular calcification.  There was mild proximal-mid LAD calcification.  The aortic root appeared quite large and there were tortuous iliac arteries and guide wire exchange was used throughout the procedure.  For this reason, aortic root angiography was done in the LAO projection, which demonstrated no aortic regurgitation, a three-leaflet, normal-looking aortic valve, and a normal aortic root with no dissection or aneurysm formation and mild coiling probably related to his hypertension.  The brachial cephalic vessels were normal proximally.  Subselective LIMA and RIMA demonstrated a patent RIMA, patent LIMA, and no brachiocephalic or subclavian stenosis.  The abdominal aortic angiogram showed patent celiac and SMA access.  The left renal artery had 20% narrowing and the right renal artery on selective injection had 50% ostial and proximal narrowing, but good flow.  The renal arteries were single bilaterally.  The hypogastric was intact.  The infrarenal  abdominal aorta had mild atherosclerotic disease with no aneurysm or stenosis. The proximal iliacs were normal.  LV angiogram in the RAO projection showed hypokinesis of the mid anterolateral wall that was mild.  There were occasional PVCs, but there were no other segmental wall motion abnormalities and there were no wall motion abnormalities in  the LAO projection.  The EF was approximately 50-55% with no mitral regurgitation.  This very pleasant, 77 year old, white, retired, married, father of two with three grandchildren is a nonsmoker.  He is a retired [employer] copy.  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  He has episodic dyspnea on exertion directly related with intermittent exercise intolerance and vague exertional related substernal chest discomfort. Particularly over the last several months he has noted extreme fatigue with ambulation.  He has a history of GERD and he felt that his chest heaviness in the past was related to this.  The patient is status post TURP remotely by Rozanna Boer., M.D.  He has had a history of sinusitis several weeks ago, resolved on medication by Keturah Barre, M.D.  He has had a remote unremarkable Cardiolite in July of 2000, showing some apical reverse redistribution.  He was seen by me in consultation on February 05, 2000, and it was elected to proceed with diagnostic catheterization with concerns about his exertional symptoms being ischemia related.  The main left coronary artery was normal.  The left anterior descending coronary artery had an 85% disrupted mildly segmental plaque just after the second diagonal branch at the junction of the proximal third.  The mid LAD beginning at the small third diagonal branch had diffuse 40-50% narrowing.  The remainder of the LAD was widely patent and bifurcated at the apex.  The reverse diagonal was large, bifurcated, and was normal from the proximal third of the LAD after SP1.  The second diagonal had no significant ostial stenosis, was of moderate size, and appeared fairly normal.  The third diagonal from the mid LAD had a 50% narrowing near the ostia, but there was good flow and there was a relatively small branch.  The circumflex gave off a large first bifurcating  marginal branch proximally that was normal, a moderate size second marginal that was normal, a normal  small third, and a moderate size fourth marginal.  The distal circumflex bifurcated with no significant disease and there was normal flow.  The right coronary was a dominant vessel.  It was widely patent, smooth, and mildly tortuous.  There was 30% narrowing in the distal RCA before the second PDA branch and 30% narrowing of the proximal PLA, but good flow to the bifurcating PLA.  The two inferior PDA branches were normal and the RV branch from the mid portion was normal.  It was felt that this patient culprit lesion was probably his high-grade proximal-mid LAD stenosis.  It had the angiographic appearance of a possible recent ruptured plaque.  In this setting, it was felt best to proceed with percutaneous intervention.  The patient was given 500 units of heparin, 150 mg of Plavix in the lab, and another aspirin.  He was started on Aggrastat bolus plus infusion with the second IV.  ACTs were monitored throughout the procedure.  The patient received intracoronary nitroglycerin 200 mcg x 3 for hypertension. Of interest, with balloon occlusion of his LAD, his blood pressures went to the 200 range with PVCs.  It came down to the 170-180 range  with restoration of flow, but required IC nitroglycerin to come down lower than that. Physiologically this could produce ischemic equivalence.  The guiding catheter required was a 6 Jamaica JL5 ACS guiding catheter.  The lesion was crossed with a 0.014 cross (0.010 inch tip) floppy guide wire. The guide wire was free in the distal LAD.  The LAD was primarily stented with a 3.5/12 balloon expandible Sci-Med "Nir" Elite stent.  It was positioned precisely beyond the second diagonal and deployed at 12-43 and redilated at 13-30.  There was good stent apposition and expansion and good positioning. There, however, was an area of edge dissection just at  the distal edge of the stent with some hypodensity in this area.  It was felt best to cover this with associated mild disease in this area.  This was done with an overlapping 3.0/8 ACS "Tetra" balloon expandible stent.  It was positioned just underlapping the distal end of the initial stent deployed at 13-44.  The balloon was pulled back at the overlap and dilated 18 atmospheres for 43 seconds, equivalent to a 3.4 mm expansion.  The stem sides fit well with the tapering of the LAD in this area and there was good TIMI-3 flow to the distal vessel.  There was residual 40-50% narrowing from the DX3 beyond, but normal flow.  The second diagonal had some plaque shift and there was an 85% narrowing at the ostia and it was felt best to open this.  This lesion was then crossed with the same cross at 0.010 guide wire and dilated with a 2.5/15 ACS Cross Sail balloon at 7 atmospheres for 66 seconds.  The balloon was pulled back and injection showed stenosis reduction from 85% to less than 10% of the ostium of the second diagonal.  The dilatation system was removed.  The final injection showed 85% proximal-mid LAD stenosis reduced to 0%.  There was some 30-40% narrowing beyond this stent and there was 50% narrowing in the mid LAD that was unchanged.  There was no dissection with good TIMI-3 flow.  Hopefully the patient will benefit from LAD intervention for reasons as outlined above with possible exertional chest pain and ischemic equivalent. Also, I recommend therapy of his systemic hypertension with the addition of ACE inhibitors.  He has a relative bradycardia.  If he is able to start low-dose beta blockers, I would start him on this as well and possibly use an ISA-type beta blocker because of his bradycardia, such as pindolol.  Fasting lipids are pending.  CATHETERIZATION DIAGNOSES: 1. Atherosclerotic heart disease, exertional anginal equivalence. 2. Successful percutaneous transluminal  coronary angioplasty and tandem    stenting of proximal-mid left anterior descending. 3. Successful percutaneous transluminal coronary angioplasty with ostial    plaque shift of second diagonal post stenting. 4. Residual mid left anterior descending disease as outlined, moderate. 5. Mild mid anterolateral wall motion abnormality with well-preserved ejection    fraction, 50-55%. 6. Systemic hypertension left ventricular hypertrophy. 7. 50% right renal artery stenosis. 8. Lipid profile pending. DD:  02/15/00 TD:  02/16/00 Job: [id number]

## 2010-11-03 NOTE — Discharge Summary (Signed)
NAMEKEONTRE, Kirk Miller NO.:  [id number]   MEDICAL RECORD NO.:  [id number]                   PATIENT TYPE:  INP   LOCATION:  0381                                 FACILITY:  [facility]   PHYSICIAN:  Rozanna Boer., M.D.      DATE OF BIRTH:  February 15, 1924   DATE OF ADMISSION:  02/28/2002  DATE OF DISCHARGE:  03/03/2002                                 DISCHARGE SUMMARY   DISCHARGE DIAGNOSES:  1. Right upper ureteral stricture.  2. Hydronephrosis.  3. Flank pain.  4. Hematuria.  5. Coronary artery disease.   OPERATIONS/PROCEDURES:  1. Right percutaneous nephrostomy on March 01, 2002.  2. Attempted dilation of proximal stricture March 02, 2002.   BRIEF HISTORY:  This 77 year old patient was admitted with obstruction of  his right kidney.  This was felt to be secondary to ureteral scarring  secondary to a ureterolithotomy done in 1977.  The patient developed  increasing flank pain and was taken to the operating room for an attempted  double-J catheter placement by me last week.  I was able to get a Glidewire  through the strictured area but could not advance a 6 open-ended catheter  over this.  He came in with increasing pain and some nausea, but no  vomiting.   ADMISSION MEDICATIONS:  1. Altace 5 mg daily.  2. Lipitor 10 mg daily.  3. Synthroid 50 mcg daily.  4. Celebrex 200 mg a day, has been on hold recently.  5. He had been on Levaquin and some Roxicet for pain following his recent     cystoscopic procedure.   PAST MEDICAL HISTORY:  1. In the past he has had kidney stone surgery in 1977, with wound infection     complications.  Was in the hospital for about a month afterwards.  2. Hernia repair x 2.  3. PTCA and stent placement in 2002, by Dr. [name].   SOCIAL HISTORY:  The patient does not drink or smoke.   HOSPITAL COURSE:  His laboratory data showed his creatinine was 1.3, BUN was  15, electrolytes normal.  His white count  was 8600, hematocrit 45.8.  He was  taken to radiology the day after admission where he underwent a right  percutaneous tube placement with no immediate complications and only  slightly bloody urine.  This relieved his pain.  Then on March 02, 2002,  by Dr. Elly Modena attempted right double-J antegrade stent placement but,  again, we were able to get a Glidewire through the stricture but could not  advance anything over this.  The percutaneous tube was then left in place,  10-French.  His urine cleared up, and he was discharged the following day,  afebrile.    MEDICATIONS:  1. Levaquin 500 mg p.o. daily x 10.  2. Vicodin for pain p.r.n.  3. His other home medicines as listed above.   FOLLOW-UP:  He will return to my office on  March 16, 2002, for further  disposition.  Perhaps another antegrade attempt at placement of double-J or  open repair or possible nephrectomy.                                               Rozanna Boer., M.D.    HMK/MEDQ  D:  03/03/2002  T:  03/03/2002  Job:  [id number]

## 2010-11-03 NOTE — Op Note (Signed)
NAMEFELICE, DEEM NO.:  [id number]   MEDICAL RECORD NO.:  [id number]                   PATIENT TYPE:  INP   LOCATION:  0156                                 FACILITY:  [facility]   PHYSICIAN:  Rozanna Boer., M.D.      DATE OF BIRTH:  [birth year]   DATE OF PROCEDURE:  03/24/2002  DATE OF DISCHARGE:                                 OPERATIVE REPORT   PREOPERATIVE DIAGNOSIS:  Right proximal ureteral stricture.   POSTOPERATIVE DIAGNOSIS:  Right proximal ureteral stricture.   OPERATION:  Right pyeloureterostomy and excision of urethral stricture.   ANESTHESIA:  General.   SURGEON:  Courtney Paris, M.D.   ASSISTANT:  [employer], M.D. and Dr. Gwen Pounds (resident from [facility]).   BRIEF HISTORY:  This 77 year old patient had a right pyelolithotomy in 1977  complicated by he had an infection and was in the hospital a month with  drainage at that time. He presented acutely last month with right flank pain  and turned out to have a very tight stricture at the proximal right ureter.  I was unable to get a Glidewire through the stricture from below but could  not advance even a six ureteral catheter over this. Then he had a  percutaneous nephrostomy and two attempts from antegrade dilation of a  stricture were also unsuccessful. He enters now for surgical repair of this  very tight stricture.   DESCRIPTION OF PROCEDURE:  The patient was placed in the right flank  position after satisfactory induction of general anesthesia was prepped and  draped in the usual sterile fashion. His nephrostomy was prepped out in the  incision. He was given Cipro and gentamycin IV. An incision was made off the  tip of the twelfth rib and carried anterior through the old incision,  carried down to the very dense scar tissue to expose the retroperitoneal  space. The kidney was then encountered and the nephrostomy tube dissected  free from the kidney.  There was very dense adhesions around the kidney and  it took a considerable length of time to even find the ureter.  Finally when  this was found in the proximal area, it was traced up toward the renal  pelvis where the stricture was encountered. The ureter was then cut  tangentially and a 6 French 26 cm length double J ureteral stent was then  passed distally without difficulty. Further dissection on the proximal  ureter revealed another kink near the renal pelvis which had to be dissected  out before I could get good flow of urine. When this was done, there was a  considerable gap that needed to be bridged probably 2 inches or more, so I  had to mobilize the kidney anteriorly and with pexing sutures on the  Gerota's fascia pulling this down I could reach the renal pelvis with the  cut ureter. I then placed all the ureteropelvic stitches with 4-0 Vicryl  and  did not tie them, I left them loose probably about 8 or 9 in all. Then  pulling the pexing suture down, I was able to tie the suture so that there  was no tension. The kidney was then pexed in two different areas down to the  posterior psoas with some #0 Vicryl. A fully fluted 10 Blake drain was then  placed in this retroperitoneal space and brought out through separate stab  incisions, the wound was irrigated and closed in layers with a running #1  PDS suture for the internal oblique fascia and similarly for the external  oblique fascia. About 15 cc of 0.5% Marcaine was injected into the  subcutaneous tissue and the incision was then closed with clips. The drain  was secured to the skin with a nylon suture, skin dressings were then  applied. The patient lost about 550 cc of blood by estimate. He did have new  onset of atrial fibrillation during the case and required some Cardizem drip  to lower  his rate from in the high 130s down into the 90s. He remained stable  throughout. No transfusions were given. Sponge, needle and instrument  counts  were correct on two occasions. The patient was then taken to the recovery  room but will be admitted to step down and a cardiology consult will be  obtained.                                               Rozanna Boer., M.D.    HMK/MEDQ  D:  03/24/2002  T:  03/24/2002  Job:  [id number]

## 2010-11-03 NOTE — Op Note (Signed)
Kirk Miller, Kirk Miller               ACCOUNT NO.:  [id number]   MEDICAL RECORD NO.:  [id number]          PATIENT TYPE:  INP   LOCATION:  3114                         FACILITY:  [facility]   PHYSICIAN:  Tia Alert, MD     DATE OF BIRTH:  [birth year]   DATE OF PROCEDURE:  06/25/2007  DATE OF DISCHARGE:                               OPERATIVE REPORT   PREOPERATIVE DIAGNOSES:  Lumbar spondylosis with severe lumbar spinal  stenosis at L3-4 and L4-5 with degenerative disk disease, minor grade 1  anterior listhesis of L4 and L5, facet arthropathy with back and leg  pain.   POSTOPERATIVE DIAGNOSES:  Lumbar spondylosis with severe lumbar spinal  stenosis at L3-4 and L4-5 with degenerative disk disease, minor grade 1  anterior listhesis of L4 and L5, facet arthropathy with back and leg  pain.   PROCEDURE:  1. Decompressive lumbar laminectomy, hemi-facetectomy and      foraminotomies so that we could decompress the L3, L4, and L5 nerve      roots bilaterally requiring more work than is required of the      typical PLIF procedure.  2. Posterior lumbar interbody fusion at L3-4 and L4-5 utilizing 12 x      26 mm PEEK interbody cages, packed with local autograft and      Actifuse putty and 12 x 22 mm tangent interbody bone wedges.  3. Intertransverse arthrodesis at L3-L5 bilaterally utilizing locally      harvested morselized autologous bone graft and Actifuse putty.  4. Segmental fixation at L3-L5 utilizing the Legacy pedicle screw      system.   SURGEON:  Tia Alert, MD   ASSISTANT:  Reinaldo Meeker, MD   ANESTHESIA:  General endotracheal.   COMPLICATIONS:  None apparent.   INDICATIONS FOR PROCEDURE:  Kirk Miller is an 77 year old gentleman who  is referred with severe back pain and leg pain.  He had tried medical  management for quite some time without significant relief.  He had an  MRI and a CT myelogram which showed significant disk degeneration and  spondylosis with severe  stenosis at L3-4 and L4-5.  He had mostly  mechanical back pain.  I recommended a 2-level decompression  instrumented fusion at L3-4 and L4-5.  He understood the risks, the  benefits, and the expected outcome and wished to proceed.   DESCRIPTION OF PROCEDURE:  The patient was taken to the operating room  and after induction of adequate generalized endotracheal anesthesia, he  was rolled in the prone position on chest rolls and all pressure points  were padded and his lumbar region was prepped with DuraPrep and then  draped in the usual sterile fashion.  An 8 mL of local anesthesia was  injected and a dorsal midline incision was made and carried down to the  lumbosacral fascia.  The fascia was opened and the paraspinous  musculature was taken down in a subperiosteal fashion to expose L3-4 and  L4-5.  Intraoperative fluoroscopy confirmed my level and I took the  dissection out over the facets to expose the transverse processes  of the  L3, L4, and L5.  I then removed the spinous processes and used a  combination of the high-speed drill, the Kerrison punches, and the  Leksell rongeur to perform complete laminectomies, hemi-facetectomies,  and foraminotomies at L3-4 and L4-5.  The lateral recesses were severely  stenosed.  I marched along the medial and inferior pedicle wall of L3,  L4, and L5, the nerve roots were identified and followed out into their  respective foramina and decompressed distally into their foramina.  Wide  foraminotomies were performed, far and excessive of that required to  perform the PLIF procedure.  This was done because of his severe  stenosis.  Once the decompression was complete, I coagulated the  epidural venous vasculature, cut this sharply and we turned our  attention to the PLIF procedure.  We started at L4-5.  We incised the  disk space and performed our initial diskectomy with pituitary rongeurs.  We then distracted the disk space sequentially up to 12 mm,  checked this  under fluoroscopy, then used the 12-mm rotating cutter and 12-mm cutting  chisel and Epstein curettes to perform a complete diskectomy.  A 12 x 26  mm tangent interbody bone wedge was used on the patient's left side and  a 12 x 26 mm PEEK interbody cage, packed with local autograft and  Actifuse was tapped on the patient's left side.  We performed the exact  same procedure at L3-4 incising the disk space to perform our initial  diskectomy with pituitary rongeurs and completed our diskectomy with  Epstein curettes, rotating cutter, and 12-mm cutting chisel.  We then  placed a 12 x 26 mm PEEK interbody cage, packed with local autograft and  Actifuse putty on the patient's left side and on the right side used a  tangent interbody bone wedge.  The midline at both levels were packed  local autograft and Actifuse putty.  We then turned our attention to the  segmental fixation.  We localized the pedicle screw entry zones,  utilizing surface landmarks and lateral fluoroscopy.  We probed each  pedicle with pedicle probe, tapped each pedicle with a 5-5 tap, palpated  each pedicle with a ball probe to assure no break in the cortex and then  placed 65 x 50 mm pedicle screws into the L3, L4, and L5 pedicles  bilaterally.  We then decorticated the transverse processes and placed a  mixture of local autograft and Actifuse out of these to perform  intertransverse arthrodesis at L3-L5.  We then placed the lordotic rods  into the multiaxial screw heads, locked these into position with locking  caps and anti-torque device while achieving compression of our grafts.  We then irrigated with saline solution containing bacitracin, dried all  bleeding points, lined the dura with Gelfoam after feeling along the  nerve roots with coronary dilator to assure adequate decompression.  We  placed a medium Hemovac drain through a separate stab incision and then  closed the muscle and the fascia with 0  Vicryl, closed the subcutaneous  and subcuticular tissue with 2-0 and 3-0 Vicryl and closed the skin with  benzoin and Steri-Strips.  The drapes were removed.  A sterile dressing  was applied.  The patient was awakened from general anesthesia and  transferred to the recovery room in stable condition.  At the end of the  procedure, all sponge, needle, and instrument counts were correct.      Tia Alert, MD  Electronically Signed  DSJ/MEDQ  D:  06/24/2008  T:  06/25/2008  Job:  [id number]

## 2010-11-03 NOTE — Op Note (Signed)
NAMEBURLEY, KOPKA NO.:  [id number]   MEDICAL RECORD NO.:  [id number]                   PATIENT TYPE:  INP   LOCATION:  2313                                 FACILITY:  [facility]   PHYSICIAN:  Guadalupe Maple, M.D.               DATE OF BIRTH:  January 07, 1924   DATE OF PROCEDURE:  02/12/2003  DATE OF DISCHARGE:                                 OPERATIVE REPORT   PROCEDURE:  Intraoperative transesophageal echocardiography.   HISTORY:  [name] is a 77 year old white male who is scheduled to  undergo coronary artery bypass grafting by Dr. Tyrone Sage. Upon admission, the  patient was noted to be in atrial fibrillation of unknown duration and he  remained in atrial fibrillation up until the time he is to undergo coronary  artery bypass grafting. Dr. Tyrone Sage requested that I perform intraoperative  transesophageal echocardiography to evaluate whether there was any thrombus  noted in the left atrium or left atrial appendage and also to serve to  assess his left ventricular function and the status of his valve.   DESCRIPTION OF PROCEDURE:  The patient was brought to the operating room at  [facility] and general anesthesia was induced without difficulty.  The trachea was intubated easily. The transesophageal echocardiography probe  was then inserted into the esophagus without difficulty.   IMPRESSION:  Pre-bypass findings:  1. Upon interrogation of the left atrial appendage, there was a structure     within the left atrial appendage which appeared very suspicious for a     thrombus. It did not have the classic features of a thrombus in that it     appeared denser than a thrombus normally was and it did not occupy the     entire appendage. However, it did appear mobile at times. It was     approximately 1 cm in length. There was no thrombus noted in the     remainder of the left atrium and there was no spontaneous echo contrast     noted in the  left atrium.  2. Left ventricular function revealed moderate left ventricular hypertrophy.     The left ventricular wall thickness was 1.9 cm at end diastole in the mid     papillary level. The left ventricular function appeared to be good;     however, the assessment was somewhat limited because the patient was in     atrial fibrillation. However, taking this into consideration, left     ventricular ejection fraction was estimated at 50%.  3. 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 The leaflets both were redundant, thickened but     there was no frank mitral valve prolapse. There was trace mitral     insufficiency.  4. The aortic valve appeared normal. This trial leaflet opened normally,  there was no aortic insufficiency.  5. The right ventricle was mildly dilated. There appeared to be good     contractility of the right ventricular free wall.  6. The tricuspid valve structurally appeared normal, there was trace     tricuspid insufficiency.  7. The inner atrial septum was intact without evidence of patent foramen     ovale.  8. The proximal ascending aorta showed no evidence of significant     atheromatous disease.   Post bypass findings:  1. The left atrial appendage appeared to be obliterated and could not be     visualized from multiple views. Again the left atrium showed no evidence     of thrombus and there was no spontaneous echo contrast in the left     atrium.  2. Left ventricular function appeared normal. The patient was then in sinus     rhythm and ejection fraction was estimated at 50-55% with good     contractility in all segments. There was moderate left ventricular     hypertrophy.  3. Mitral valve was unchanged from the pre-bypass study. Both anterior and     posterior leaflets were redundant,     moderately thickened but there was trace mitral insufficiency.  4. The aortic valve appeared unchanged from  the pre-bypass study. There was     no evidence of aortic stenosis or insufficiency.                                               Guadalupe Maple, M.D.    DCJ/MEDQ  D:  02/12/2003  T:  02/13/2003  Job:  [id number]   cc:   Guadalupe Maple, M.D.  [address]  [place]  [place] [address]  Fax: [phone]

## 2010-11-03 NOTE — Op Note (Signed)
Kirk Miller, Kirk Miller                        ACCOUNT NO.:  [id number]   MEDICAL RECORD NO.:  [id number]                   PATIENT TYPE:  AMB   LOCATION:  [facility]                                 FACILITY:  [facility]   PHYSICIAN:  Rozanna Boer., M.D.      DATE OF BIRTH:  1924/06/05   DATE OF PROCEDURE:  02/25/2002  DATE OF DISCHARGE:                                 OPERATIVE REPORT   PREOPERATIVE DIAGNOSIS:  Right ureteral obstruction (not a stone).   POSTOPERATIVE DIAGNOSES:  1. Right ureteral obstruction (not a stone).  2. Proximal ureteral stricture.   PROCEDURES:  1. Cystoscopy.  2. Retrograde pyelogram.  3. Right ureteroscopy.   ANESTHESIA:  General.   SURGEON:  Courtney Paris, M.D.   BRIEF HISTORY:  This 77 year old patient presented with right flank pain  with nausea and vomiting on 02/17/02.  He had hydronephrosis on ultrasound and  obstruction seen on IVP 02/18/02 all the way down to the bladder.  There was  some tortuosity of the proximal ureter on the right side.  He had a previous  TURP in 1986 and 1994.  He had a stone attack in 1983 but never passed a  stone.  He enters now for cystoscopy, retrograde, and possible stent  placement if obstruction is still present.  He has not had any pain in the  last couple of days.   DESCRIPTION OF PROCEDURE:  The patient was placed on the operating table in  the dorsal lithotomy position and after satisfactory induction of general  anesthesia was prepped and draped with Betadine in the usual sterile fashion  and given IV antibiotics.  The cystoscope was done.  He had no anterior  strictures.  The posterior urethra showed a wide open posterior urethra from  previous TURPs, and the bladder was entered.  No bladder mucosal lesions  seen.  The right orifice looks normal.  A 6 open-ended ureteral catheter was  passed and occlusive retrograde demonstrated dilated ureter all the way down  to the bladder, but the dye would  not go past the proximal ureter, where  there was seen to be an obstruction.  I then passed the open-ended stent all  the way up to this area and then could see a little tortuosity, and it looks  like a narrowing of the ureter at that point.  I was able to get under  fluoroscopy a Glidewire through the stricture and up into the kidney but  could not advance the 6 open-ended ureteral catheter.  I then removed this  and took a 6 short ureteroscope and demonstrated there was no distal  obstruction or stone, passed this all the way up to the area in question,  where I could see a stricture.  This was about a 2-3 [place] size stricture.  I could get the guidewire through it but could not advance it through the  tortuosity into the kidney.  I  then abandoned  the procedure, did not leave a stent, removed the cystoscope,  gave him a B&O suppository, and let him react from the anesthetic, returned  to the recovery room in good condition.  Since he is asymptomatic, will just  follow him.  This may be a chronic obstruction, in which case nothing  further needs to be done, but will wait to see.                                                Rozanna Boer., M.D.    HMK/MEDQ  D:  02/25/2002  T:  02/26/2002  Job:  [id number]

## 2010-11-03 NOTE — H&P (Signed)
NAME:  Kirk Miller, Kirk Miller NO.:  [id number]   MEDICAL RECORD NO.:  [id number]                    PATIENT TYPE:   LOCATION:                                       FACILITY:   PHYSICIAN:  Rozanna Boer., M.D.      DATE OF BIRTH:   DATE OF ADMISSION:  03/23/2002  DATE OF DISCHARGE:                                HISTORY & PHYSICAL   HISTORY OF PRESENT ILLNESS:  This 77 year old white male is admitted with a  right proximal ureteral stricture from an old ureteral lithotomy in 1977  with postoperative infection. He had acute right flank pain on February 17, 2002 and eventually revealed the stricture. I was able to get a glide wire  through the stricture but could not advance a #6 ureteral catheter on  February 25, 2002. He was re-admitted for right percutaneous nephrostomy on  March 01, 2002 when he again became obstructed. Attempted retrograde  dilation was not successful on March 02, 2002 nor was one today, on  March 23, 2002. A guidewire was able to be passed beyond the stricture but  nothing would dilate the stricture. He is re-admitted at this time for  surgical exploration tomorrow for attempt at repair and possible  nephrectomy. He had equal function on a renal scan on March 18, 2002 and he  has a left mid stone renal calculus that has been stable since 1989 at 7.5  mm. He is admitted after the attempted dilation and to have the surgery  first thing in the morning.   MEDICATIONS:  1. Altace 5 mg per day.  2. Isosorbide 30 mg daily.  3. Levaquin daily.  4. He has been off his aspirin and Plavix for over one week.  5. Lipitor 10 mg daily.  6. Synthroid 50 mcg per day.   ALLERGIES:  PENICILLIN AND SULFA.   PAST SURGICAL HISTORY:  Included hernia times two in 1960 to 1970.  Transurethral prostate resection in 1986 and again in May of 1994. He had a  carpal tunnel on the right in 1995 and a PTCA with Stent in 2002 by Dr.  [name].   SOCIAL HISTORY:  Non-smoker.   REVIEW OF SYSTEMS:  Does have nocturia times three. No GI problems or  ulcers. Previously was on Flomax.   FAMILY HISTORY:  He is married but his wife had a recent cardiac Stent in  April 2003 and she has had recurrence of breath cancer after 24 years. She  has had renal blockage and abdominal aortic aneurysm. He has two children,  both grown and in good health.   PHYSICAL EXAMINATION:  VITAL SIGNS: Temperature is afebrile. Pulse 82,  respiratory rate 18, weight 224 pounds. Blood pressure is 132/80.  GENERAL: He is a tall, balding, pleasant, white male in no acute distress.  HEENT: Clear. Oropharynx benign.  NECK: Supple.  CHEST: Clear. No murmur, rub, or gallop.  ABDOMEN: Soft and benign. He  does have a percutaneous tube in the right  kidney. No masses or tenderness. No enlargement of the liver or spleen.  GU: Penis is normal. Circumcised with adequate meatus. Bilateral distended  testes. Prostate about 35-40 grams, not fixed or indurated.  EXTREMITIES: No edema. Good distal pulses.   IMPRESSION:  1. Right proximal ureteral stricture, probably from old ureteral lithotomy     in 1977 with infection.  2. Left renal stone, stable.  3. Status post cardiac Stent.  4. Right hydronephrosis.   RECOMMENDATIONS:  Surgical exploration and attempt at repair. Possible  nephrectomy, only if absolutely necessary. The patient understands these  risks and concerns.                                               Rozanna Boer., M.D.    HMK/MEDQ  D:  03/23/2002  T:  03/23/2002  Job:  [id number]   cc:   Rozanna Boer., M.D.   Richard A. [name], M.D.  [phone] [address], [address]  [place]  [place] [address]  Fax: [phone]   Lucila Maine, M.D.

## 2010-11-03 NOTE — Cardiovascular Report (Signed)
Kirk Miller, OZGA NO.:  [id number]   MEDICAL RECORD NO.:  [id number]                   PATIENT TYPE:  INP   LOCATION:  2007                                 FACILITY:  [facility]   PHYSICIAN:  Gwenith Daily. Tyrone Sage, M.D.            DATE OF BIRTH:  11/04/1923   DATE OF PROCEDURE:  DATE OF DISCHARGE:  02/20/2003                              CARDIAC CATHETERIZATION   HISTORY OF PRESENT ILLNESS:  This is a 77 year old male with history of  coronary artery disease having had a previous percutaneous intervention in  2000 by Dr. [name].  Specifically, he had tandem LAD stents of the  proximal and mid LAD.  In regards to studies, his most recent Cardiolite in  October of 2003 showed mild inferoapical ischemia with a left ventricular  ejection fraction of 50%.  For the past few days prior to admission, the  patient has had episodes of increasing diaphoresis especially with exertion  as well as some dyspnea on exertion and increasing fatigue.  The patient had  a recent followup with Dr. Aldean Ast and during office visit he noted some  chest discomfort.  He was subsequently referred back to cardiology and he  was felt to require admission for further evaluation and treatment.   PAST MEDICAL HISTORY:  1. Coronary artery disease  2. Hypertension.  3. Left ventricular hypertrophy.  4. Hyperlipidemia.  5. Hypothyroidism.  6. History of percutaneous nephrotomy.  7. History of multiple kidney stones.  8. History of attempted dilatations and ureteral pyeloplasty  9. History of transurethral prostatic resection.  10.      History of carpal tunnel surgery.  11.      History of perioperative atrial fibrillation.  12.      History of hernia repair.  13.      History of paroxysmal atrial fibrillation.   MEDICATIONS ON ADMISSION:  1. Zocor 20 1/2 tablet daily.  2. Lisinopril 20 mg 1/2 tablet daily.  3. Metoprolol 50 mg 1/2 tablet b.i.d.  4. Levothyroxine 50 mcg one  p.o. daily.  5. Aspirin 81 mg daily.   Social history, family history, review of symptoms and physical exam please  see the history and physical done at the time of admission.   HOSPITAL COURSE:  The patient was admitted with chest pain with known  coronary artery disease and paroxysmal atrial fibrillation.  The patient was  felt to require nitroglycerin as well as intravenous heparin and scheduled  for cardiac catheterization.  On February 09, 2003, the patient underwent a  cardiac catheterization and was found to have severe multivessel coronary  artery disease including 50-60% mid right coronary artery lesion, a small  posterior descending branch but with 70% stenosis.  In addition, there was  marked progression of the disease in the circumflex system with 70-80%  stenosis involving the first obtuse marginal and distal circumflex.  The LAD  had an ostial lesion  prior to the area that was previously stented of 70-80%  of the area of the stent narrowed to approximately 50-60% lumen of the  surrounding vessel.  Due to these findings, the patient was referred for  surgical evaluation by Dr. Tyrone Sage who evaluated the patient and his  studies and agreed with recommendations to proceed with surgical  revascularization procedure.  On February 12, 2003, the patient was taken to  the operating room where he underwent the following procedure: Coronary  artery disease bypass grafting x4.  The following grafts were placed.  1.  Left internal mammary artery to the LAD.  2.  Saphenous vein graft to the  obtuse marginal one and circumflex sequential graft.  3.  Vein graft to the  posterior descending.  The patient also had stapling of his left atrial  appendage.  The patient tolerated the procedure well and taken to the  surgical intensive care unit in stable condition.   POSTOPERATIVE HOSPITAL COURSE:  Postoperatively, had some difficulties with  significant amounts of nausea.  He was initially placed  on amiodarone for  his perioperative atrial fibrillation, but this was subsequently  discontinued due to the constant nausea.  The patient was subsequently  started on Coumadin.  He did have some episodes of premature atrial  contractions, but he has mostly maintained normal sinus rhythm in the  postoperative period.  The patient's INR has been checked daily and he is  currently therapeutic at 2.0 on his INR.  Slowly, he has advanced in regard  to  his cardiac rehabilitation phase one modalities commensurate to level of  postoperative convalescence.  His incisions are healing well. His oxygen has  been weaned and he maintains good saturations on room air.  He has undergone  a general diuresis.  Tentatively, he is felt to be stable for discharge in  the morning of February 20, 2003 pending morning round re-evaluation.   MEDICATIONS ON DISCHARGE:  1. Synthroid 50 mcg daily.  2. Zocor 10 mg daily.  3. Lopressor 75 mg twice daily.  4. Flomax 0.4 mg daily.  5. Coumadin 5 mg daily and as directed. He will have his first blood drawn     on February 23, 2003 through Dr. Kandis Cocking office.   INSTRUCTIONS:  The patient will receive written instructions in regard to  medications, activity, diet, wound care and followup.   FOLLOW UP:  Dr. Tyrone Sage Thursday, September 30 at 11:30.  Additional  followup will include Dr. [name] in two weeks.  He will also followup  with his urologist as they require.   CONDITION ON DISCHARGE:  Stable and improved.   FINAL DIAGNOSIS:  Severe multivessel coronary artery disease as described  above.   OTHER DIAGNOSES:  1. Hypertension.  2. Hyperlipidemia.  3. Hypothyroidism.  4. History of multiple urological procedures including nephrotomy, ureteral     pyeloplasty.  5. History of kidney stones.  6. History of paroxysmal atrial fibrillation.  7. History of perioperative atrial fibrillation. 8. Postoperative premature atrial contractions.  9. History of  transurethral prostatic resection.  10.      History of hernia repair.   LABORATORY DATA:  Most recent INR is 2.0 on February 19, 2003. Most recent  hemoglobin, hematocrit dated February 15, 2003 are 10 and 30 respectively.  Electrolytes,  BUN and creatinine are within normal limits on February 16, 2003.       Rowe Clack, P.A.-C.  Gwenith Daily Tyrone Sage, M.D.    Sherryll Burger  D:  02/19/2003  T:  02/20/2003  Job:  [id number]   cc:   Dr. Lucila Maine   Rozanna Boer., M.D.  [address]  [place]  [place] [address]  Fax: [phone]

## 2010-11-03 NOTE — Discharge Summary (Signed)
[facility]  Patient:    Kirk Miller, Kirk Miller                      MRN: [id number] Adm. Date:  [id number] Disc. Date: [id number] Attending:  Ruta Hinds Dictator:   [facility] CC:         Kirk Miller, primary care, [place], [place]  Kirk Boer., M.D., urology  Kirk Miller, M.D.   Discharge Summary  ADMISSION DIAGNOSES: 1. Unstable angina. 2. Benign prostatic hypertrophy, status post transurethral resection of the    prostate. 3. History of kidney stones. 4. Status post hernia repair x 2. 5. History of repair to the nasal septum. 6. History of colon polypectomy. 7. History of carpal tunnel repair.  DISCHARGE DIAGNOSES: 1. Unstable angina. 2. Benign prostatic hypertrophy, status post transurethral resection of the    prostate. 3. History of kidney stones. 4. Status post hernia repair x 2. 5. History of repair to the nasal septum. 6. History of colon polypectomy. 7. History of carpal tunnel repair. 8. Post cardiac catheterization on February 15, 2000, by Kirk Miller,    M.D., with intervention to the left anterior descending.  He has a 50%    right renal artery stenosis.  Ejection fraction is 50 to 55% at    catheterization. 9. Status post hematuria and urinary retention which Kirk Boer., M.D., consulted and followed up during the hospitalization.  HISTORY OF PRESENT ILLNESS:  Kirk Miller is a 77 year old white married male who presented on February 05, 2000, for an initial cardiac evaluation with Dr. Alanda Miller as a referral from Kirk Miller in [place], [place].  At that time he had no significant cardiac risk factors and no history of coronary disease.  At that time in the office Kirk Miller stated that his cardiac history dated back to approximately three to four years ago.  At that time he had been in the hospital for carpal tunnel repair.  During  that hospitalization he had been told that he had some "skipped beats."  He was then referred to [facility] where he had a monitor.  He states that the monitor showed no significant dysrhythmias.  Then last Spring he was experiencing increased weakness and shortness of breath.  He was then sent back to [facility] where he underwent a stress test.  At that time he underwent a Cardiolite which was reportedly negative. He was told to start an exercise program of walking 20 minutes a day.  He did this.  He states that he had been able to do his walking without difficulty until approximately three to four months ago.  Over the past several months, he has had increased dyspnea on exertion while walking.  Sometimes he even has to take a break and rest during the walk. As well, when he gets back to his house he is extremely fatigued and winded.  He also stated that he felt as if he might be experiencing some pressure and heaviness at the times of these walks. However, he is uncertain of how much of this is related to exertion.  He always felt that the heaviness was secondary to GERD and flatulence.  He states that this can come on at any time including during his walks but can also come on at other times as well.  Additionally, he stated that he had been extremely fatigued.  He generally  does a lot of walking around the house and is very active.  However, recently he stated that his wife can wake him up in the morning and start breakfast and by the time he gets dressed and is ready for breakfast, he feels as if he needs to rest in the chair again.  He denied having any paroxysmal nocturnal dyspnea and had no orthopnea or no lower extremity edema.  He stated that when he did have the pressure and heaviness in his chest, it developed under the left breast region. There was radiation of the pain and no other associated symptoms other than shortness of breath.  Physical  examination at that time revealed some hypertension.  Blood pressure checked by Dr. Alanda Miller was 170/83.  Otherwise his examination was essentially benign.  EKG revealed normal sinus rhythm at 58 beats per minute. There was some J point elevation with ST change in leads II and III consistent with repolarization changes.  There were no other specific ST or T-wave changes.  At that time, the patient was seen by Dr. Susa Griffins.  It was felt that he was experiencing the following:  Hypertension.  At this time, we had him continue Isosorbide 30 mg a day.  As well, we added Altace 2.5 a day.  We also plan to check fasting lipid profile.  As well, given that he had already had a negative Cardiolite but continued to have what sounded like exertional angina, we plan to set him up for cardiac catheterization.  Dr. Alanda Miller reviewed this with both the patient and his wife and they were both agreeable with this approach.  HOSPITAL COURSE:  On February 15, 2000, Kirk Miller was admitted to [facility] for cardiac catheterization to be performed by Dr. Susa Griffins.  Please see Dr. Mancel Parsons dictated catheterization report for further details.  At catheterization he was found to have the following. The right coronary artery had some distal 30% stenosis.  Otherwise RCA normal. The left main is normal.  The left circumflex had some mid irregularities but otherwise is normal.  As well, the branches off the left circumflex were all patent. The LAD has an 85% mid stenosis.  Dr. Alanda Miller then performed PTCA and stent to this.  It went from an 85% stenosis to a 0% residual. There were three diagonals coming off of the LAD and all of these were patent.  Left ventricular function was normal with EF 50 to 55%.  There was no MR.  Also of note, he did have a 50% right renal artery stenosis.  On February 16, 2000, Kirk Miller had complaints of dysuria and hematuria. At  that point  he was off of Aggrastat.  He had no chest pain or shortness of breath.  His hemoglobin was stable at 14 and hematocrit at 38.  He was afebrile at 98.2.  His groin was stable with no hematoma and had good distal pulses.  Kirk Miller has a history of BPH and status post TURP and is followed by Dr. Aldean Ast. Therefore, Dr. Aldean Ast was consulted at that time regarding hematuria and dysuria.  Lenise Herald, M.D., planned to discontinue the Foley and have a trial of voiding.  At that time we also increased his Altace to 5 mg b.i.d. for hypertension and planned for potassium repletion.  Later on that afternoon, we reevaluated Mr. Solly. At that time he had a distended bladder and was unable to void with the Foley out.  The I&O catheter  had revealed a 700+ cc residual. This was bloody. We then spoke with Dr. Aldean Ast.  He stated to keep Mr. Schellhorn overnight and to insert a 22-gauge Foley indwelling.  It was felt that he might need irrigation of the bladder for clots.  We would plan to discontinue the Foley in the morning and if he is still having voiding difficulty, we would recontact him.  We were to also start Flomax.  On February 17, 2000, Mr. Brotherton was feeling better.  His white blood cells were somewhat elevated at 11.2, so we planned to check a UA with C&S.  On February 17, 2000, Mr. Hambly was seen by Dr. Aldean Ast.  He catheterized with a #24 Foley and irrigated the bladder until clear.  It was felt that if he was able to void without a catheter, then we could restart Plavix and plan for discharge home.  On February 18, 2000, his hematuria had cleared and it was felt that he could be discharged home with the Foley catheter for voiding purposes.  Dr. Aldean Ast felt that it was fine for him to restart on his Plavix and to be discharged home with follow-up arranged by Dr. Aldean Ast.  From a cardiac standpoint, he has remained stable with no further chest pain or shortness  of breath.  He was afebrile at 97.5, pulse 55, blood pressure 155/85.  His wbcs were down to 7.8.  Hemoglobin and hematocrit have remained stable. His right groin was stable with no ecchymosis and no hematoma.  HOSPITAL PROCEDURES:  Cardiac catheterization on February 15, 2000, by Dr. Susa Griffins, with PTCA stent to the mid LAD.  RCA and left circumflex had no significant disease. Ejection fraction was normal with Left ventricular ejection fraction 50 to 55% and no MR. There was 50% right renal artery stenosis noted also.  HOSPITAL CONSULTS:  Urology consultation with Kirk Boer., M.D., on February 16, 2000, for evaluation of hematuria and dysuria.  HOSPITAL LABORATORIES:  Urinalysis on February 17, 2000, reveals large hemoglobin, large bilirubin, 15 ketones, greater than 300 protein, positive nitrite, large esterase.  Blood culture was negative.  Metabolic profile was normal with sodium 138, potassium 3.9, glucose 87, BUN 17, creatinine 1.0. Liver profile reveals total cholesterol 157, triglycerides 50, HDL 41, LDL 106.  Cardiac enzymes are negative with CKs of 89 and MB 2.4.  On February 18, 2000, the day of discharge, wbc is 7.8, hemoglobin 12.7, hematocrit 34.7, and platelets 155.  RADIOLOGY:  Chest x-ray on February 15, 2000, reveals no active cardiopulmonary process.  EKG on February 15, 2000, reveals sinus bradycardia at 49 beats per minute with nonspecific STT changes.  There are some J point elevations in leads II, III and aVF as well as V1 through V3.  This is found to be secondary to repolarization changes.  DISCHARGE MEDICATIONS: 1. Altace 5 mg one p.o. b.i.d. 2. Enteric coated aspirin 325 mg one q.d. 3. Protonix 40 mg one q.d. 4. Plavix 75 mg one q.d. 5. Lipitor 10 mg one q.d. 6. Isosorbide mononitrate 30 mg one q.d. 7. Nitroglycerin 0.4 mg sublingual as directed for chest pain. 8. Cipro 500 mg one p.o. b.i.d. for 10 days.  ACTIVITY:  No strenuous  activity, lifting greater than five pounds, driving or sexual activity for two days.  DIET:  Low salt, low fat, low cholesterol diet.  WOUND CARE:  He may shower but do not sit in a tub or swim for one week.  DISCHARGE INSTRUCTIONS:  Call  our office at [phone] if there are any problems or questions.  FOLLOW-UP:  He will have follow-up with Meriam Sprague A. Delanna Ahmadi, R.N., N.P., March 05, 2000, at 10:40 a.m.  He may have instructions to follow with Dr. Aldean Ast and have instructions for removing the catheter. DD:  02/20/00 TD:  02/21/00 Job: [id number]

## 2010-11-03 NOTE — Op Note (Signed)
NAMECHEN, Kirk NO.:  [id number]   MEDICAL RECORD NO.:  [id number]                   PATIENT TYPE:  INP   LOCATION:  2007                                 FACILITY:  [facility]   PHYSICIAN:  Gwenith Daily. Tyrone Sage, M.D.            DATE OF BIRTH:  1923/12/29   DATE OF PROCEDURE:  02/12/2003  DATE OF DISCHARGE:                                 OPERATIVE REPORT   PREOPERATIVE DIAGNOSIS:  Coronary occlusive disease with unstable angina.   POSTOPERATIVE DIAGNOSIS:  Coronary occlusive disease with unstable angina.   SURGICAL PROCEDURE:  Coronary artery bypass grafting x 4 with the left  internal mammary to the left anterior descending coronary artery, sequential  reverse saphenous vein graft to the obtuse marginal coronary artery and  distal circumflex coronary artery and reverse saphenous vein graft to the  posterior descending coronary artery.  Staple closure of the left atrial  appendage.   SURGEON:  Gwenith Daily. Tyrone Sage, M.D.   FIRST ASSISTANT:  Kerin Perna, M.D.   SECOND ASSISTANT:  Rowe Clack, P.A.-C.   BRIEF HISTORY:  The patient is a 77 year old male who presented with new  onset of anginal chest pain.  He had been having increasing episodes of pain  and when seeing Dr. Aldean Ast, his urologist, for followup mentioned that he  had chest discomfort.  He was referred to cardiology.  He had a previous  history of angioplasty of the LAD approximately three years before.  Stress  test was positive and a cardiac catheterization performed by Dr. Jenne Campus.  This demonstrated 50-60% mid right coronary artery lesion, a small posterior  descending branch but with 70% stenosis.  In addition, there had been marked  progression of disease in the circumflex system with 70-80% stenosis  involving the first obtuse marginal and distal circumflex.  The LAD had an  ostial lesion prior to the area that was previously stented of 70-80% of the  area of the stent  narrowed to approximately 50-60% lumen of the surrounding  vessel.  Because of the evidence of coronary artery disease and with three  vessel coronary artery disease coronary bypass grafting was recommended.  Patient agreed and signed informed consent.   DESCRIPTION OF PROCEDURE:  With Swan-Ganz and arterial line monitors placed,  the patient underwent general endotracheal anesthesia without incident.  The  skin of the chest and legs was prepped with Betadine and draped in the usual  sterile manner.  Using the [employer] endovein harvesting system, the vein was  harvested from the right thigh and was of good quality and caliber.  A  medium sternotomy was performed.  The left internal mammary artery was  dissected out as a pedicle graft.  The distal artery was divided and had  good free flow.  The pericardium was opened.  Overall ventricular function  appeared preserved.  The patient was systemically heparinized.  The  ascending aorta and  the right atrium were cannulated, and in the aortic  root, aortic cardioplegia needle was introduced into the ascending aorta.  The patient was placed on cardiopulmonary bypass 2.4 liters per minute per m  sq.  Sites of anastomoses were selected and dissected out of the epicardium.  The patient's body temperature was cooled to 30 degrees.  The aortic cross  clamp was applied and 500 cc of cold blood potassium cardioplegia was  administered with rapid diastolic arrest of the heart.  Myocardial septal  temperature was monitored throughout the cross clamp.   Attention was turned first to the posterior descending coronary artery which  was opened and admitted a 1.5 mm probe.  Using a running 7-0 Prolene, a  distal anastomosis was performed.  Attention was then turned to the first  obtuse marginal coronary artery which was opened admitting a 1.5 probe using  the diamond type.  In a side-to-side anastomosis with a running 7-0 Prolene,  an anastomosis was carried  out.  The distal extent of the same vein was then  carried to the distal circumflex which was opened and also admitted a 1.5 mm  probe.  Using a running 7-0 Prolene, a distal anastomosis was performed.  Attention was then turned to the left anterior descending coronary artery  which was opened between the mid and distal third of the vessel.  It  admitted a 1.5 mm probe.  Distally, the probe would not pass the short  distance proximally and would not pass presumably in the area of the stent.  Using a running 8-0 Prolene, a left internal mammary artery was anastomosed  to the left anterior descending coronary artery.  Prior to release of the  cross clamp, the left atrial appendage, a vascular stapler was placed across  the atrial appendage to obliterate it at the base.  Because of the patient's  previous history of atrial fibrillation, the bulldog on the mammary artery  was then removed.  With proper rise of the myocardial septal temperature,  the aortic cross clamp was removed.  The patient required electrical  defibrillation to return to a sinus rhythm.  Partial occlusion was placed on  the ascending on the ascending aorta.  Two punch aortotomies were performed.  Each of the two vein grafts were anastomosed to the ascending aorta.  Air  was evacuated from the grafts, and the partial occlusion clamp was removed.  Sites of anastomoses were inspected and were free of bleeding.  The patient  was then ventilated, weaned from cardiopulmonary bypass without difficulty  in sinus rhythm.  Two atrial and two ventricular pacing wires were applied.  Graft markers were applied.  The patient was then ventilated and weaned from  cardiopulmonary bypass and remained hemodynamically stable.  He was  decannulated in the usual fashion.  Protamine sulfate was administered.  With the operative field hemostatic, the pericardium was loosely reapproximated.  A left pleural tube and two mediastinal tubes were left  in  place.  The sternum was closed with #6 stainless steel wire.  Fascia was  closed with interrupted 0 Vicryl, running 3-0 Vicryl in the subcutaneous  tissue, 4-0 subcuticular stitch in the skin edges.  Dry dressings were  applied.  Sponge and needle counts were reported as correct at the  completion of the procedure.  The patient tolerated the procedure without  obvious complication and was transferred to the surgical intensive care unit  for further postoperative care.  Gwenith Daily Tyrone Sage, M.D.    Tyson Babinski  D:  02/14/2003  T:  02/14/2003  Job:  [id number]   cc:   Darlin Priestly, M.D.  [address]  [place]  [place] [address]  Fax: [phone]

## 2011-12-26 ENCOUNTER — Encounter (HOSPITAL_COMMUNITY): Payer: Self-pay | Admitting: Emergency Medicine

## 2011-12-26 ENCOUNTER — Emergency Department (HOSPITAL_COMMUNITY)
Admission: EM | Admit: 2011-12-26 | Discharge: 2011-12-26 | Disposition: A | Discharge status: Home / Self Care | Payer: Medicare Other | Attending: Emergency Medicine | Admitting: Emergency Medicine

## 2011-12-26 ENCOUNTER — Emergency Department (HOSPITAL_COMMUNITY): Payer: Medicare Other

## 2011-12-26 DIAGNOSIS — S0990XA Unspecified injury of head, initial encounter: Secondary | ICD-10-CM | POA: Insufficient documentation

## 2011-12-26 DIAGNOSIS — I10 Essential (primary) hypertension: Secondary | ICD-10-CM | POA: Insufficient documentation

## 2011-12-26 DIAGNOSIS — T148XXA Other injury of unspecified body region, initial encounter: Secondary | ICD-10-CM

## 2011-12-26 DIAGNOSIS — I25810 Atherosclerosis of coronary artery bypass graft(s) without angina pectoris: Secondary | ICD-10-CM | POA: Insufficient documentation

## 2011-12-26 DIAGNOSIS — Z7901 Long term (current) use of anticoagulants: Secondary | ICD-10-CM | POA: Insufficient documentation

## 2011-12-26 DIAGNOSIS — Y9229 Other specified public building as the place of occurrence of the external cause: Secondary | ICD-10-CM | POA: Insufficient documentation

## 2011-12-26 DIAGNOSIS — S51809A Unspecified open wound of unspecified forearm, initial encounter: Secondary | ICD-10-CM | POA: Insufficient documentation

## 2011-12-26 DIAGNOSIS — W100XXA Fall (on)(from) escalator, initial encounter: Secondary | ICD-10-CM | POA: Insufficient documentation

## 2011-12-26 DIAGNOSIS — T07XXXA Unspecified multiple injuries, initial encounter: Secondary | ICD-10-CM | POA: Insufficient documentation

## 2011-12-26 DIAGNOSIS — G319 Degenerative disease of nervous system, unspecified: Secondary | ICD-10-CM | POA: Insufficient documentation

## 2011-12-26 HISTORY — DX: Depression, unspecified: F32.A

## 2011-12-26 HISTORY — DX: Pure hypercholesterolemia, unspecified: E78.00

## 2011-12-26 HISTORY — DX: Disorder of thyroid, unspecified: E07.9

## 2011-12-26 HISTORY — DX: Atherosclerotic heart disease of native coronary artery without angina pectoris: I25.10

## 2011-12-26 HISTORY — DX: Spinal stenosis, site unspecified: M48.00

## 2011-12-26 HISTORY — DX: Unspecified hearing loss, unspecified ear: H91.90

## 2011-12-26 HISTORY — DX: Major depressive disorder, single episode, unspecified: F32.9

## 2011-12-26 HISTORY — DX: Essential (primary) hypertension: I10

## 2011-12-26 HISTORY — DX: Unspecified atrial fibrillation: I48.91

## 2011-12-26 HISTORY — DX: Disorder of kidney and ureter, unspecified: N28.9

## 2011-12-26 HISTORY — DX: Disorder of prostate, unspecified: N42.9

## 2011-12-26 HISTORY — DX: Calculus of kidney: N20.0

## 2011-12-26 LAB — POCT I-STAT, CHEM 8
BUN: 21 mg/dL (ref 6–23)
Calcium, Ion: 1.14 mmol/L (ref 1.13–1.30)
Chloride: 104 mEq/L (ref 96–112)
Creatinine, Ser: 1.3 mg/dL (ref 0.50–1.35)
Glucose, Bld: 140 mg/dL — ABNORMAL HIGH (ref 70–99)
HCT: 42 % (ref 39.0–52.0)
Potassium: 4.2 mEq/L (ref 3.5–5.1)

## 2011-12-26 MED ORDER — BACITRACIN ZINC 500 UNIT/GM EX OINT
1.0000 "application " | TOPICAL_OINTMENT | Freq: Once | CUTANEOUS | Status: AC
  Administered 2011-12-26: 1 via TOPICAL

## 2011-12-26 NOTE — ED Notes (Signed)
Pt per EMS s/p fall on [employer] escalator fell tripped with his ca forward now with rt arm with muli laceration no loc bleeding controlled pt is aox4

## 2011-12-26 NOTE — ED Provider Notes (Signed)
History     CSN: [id number]  Arrival date & time 12/26/11  1447   First MD Initiated Contact with Patient 12/26/11 1505      Chief Complaint  Patient presents with  . Fall    HPI Pt was walking today when he lost his balance and fell on an escalator at [place] causing multiple lacerations on his right forearm and a few on the left.  The pain is mild.  The bleeding has been controlled by a dressing placed by EMS.  He does take coumadin but Pt denies LOC, back pain, chest pain, abdominal pain, numbness or weakness.  He has history of poor balance and just feels like he lost his balance.  He did not feel weak or ill. Past Medical History  Diagnosis Date  . Renal disorder   . Coronary artery disease   . Kidney stone   . Spinal stenosis   . Hard of hearing   . Prostate disorder   . High cholesterol   . Depression   . Hypertension   . Atrial fibrillation   . Thyroid disease     Past Surgical History  Procedure Date  . Tonsillectomy   . Hernia repair   . Carpal tunnel release   . Prostate surgery   . Cardiac catheterization   . Kidney tube repair   . Coronary artery bypass graft   . Lumbar fusion   . Cataract extraction     No family history on file.  History  Substance Use Topics  . Smoking status: Not on file  . Smokeless tobacco: Not on file  . Alcohol Use: Not on file      Review of Systems  All other systems reviewed and are negative.    Allergies  Meloxicam; Penicillins; and Sulfa antibiotics  Home Medications   Current Outpatient Rx  Name Route Sig Dispense Refill  . AMIODARONE HCL 200 MG PO TABS Oral Take 300 mg by mouth daily.    . ASPIRIN EC 81 MG PO TBEC Oral Take 81 mg by mouth daily.    . BETHANECHOL CHLORIDE 10 MG PO TABS Oral Take 10 mg by mouth 2 (two) times daily.    Marland Kitchen CALCIUM CARBONATE 600 MG PO TABS Oral Take 600 mg by mouth 2 (two) times daily with a meal.    . ESCITALOPRAM OXALATE 10 MG PO TABS Oral Take 10 mg by mouth daily.    Marland Kitchen  FINASTERIDE 5 MG PO TABS Oral Take 5 mg by mouth daily.    . ACIDOPHILUS PO Oral Take 460 mg by mouth daily.    Marland Kitchen LEVOTHYROXINE SODIUM 125 MCG PO TABS Oral Take 125 mcg by mouth daily.    Marland Kitchen LISINOPRIL 10 MG PO TABS Oral Take 10 mg by mouth daily.    Marland Kitchen PRAVASTATIN SODIUM 20 MG PO TABS Oral Take 20 mg by mouth daily.    Marland Kitchen TAMSULOSIN HCL 0.4 MG PO CAPS Oral Take 0.4 mg by mouth daily.    . WARFARIN SODIUM 5 MG PO TABS Oral Take 5-7.5 mg by mouth See admin instructions. Monday,thursday,saturday pt takes 7.5mg .  All other days pt takes 5 mg      SpO2 98%  Physical Exam  Nursing note and vitals reviewed. Constitutional: He appears well-developed and well-nourished. No distress.  HENT:  Head: Normocephalic and atraumatic. Head is without raccoon's eyes and without Battle's sign.  Right Ear: External ear normal.  Left Ear: External ear normal.  Eyes: Lids are normal.  Right eye exhibits no discharge. Right conjunctiva has no hemorrhage. Left conjunctiva has no hemorrhage.  Neck: No spinous process tenderness present. No tracheal deviation and no edema present.  Cardiovascular: Normal rate, regular rhythm and normal heart sounds.   Pulmonary/Chest: Effort normal and breath sounds normal. No stridor. No respiratory distress. He exhibits no tenderness, no crepitus and no deformity.  Abdominal: Soft. Normal appearance and bowel sounds are normal. He exhibits no distension and no mass. There is no tenderness.  Musculoskeletal: He exhibits tenderness. He exhibits no edema.       Cervical back: He exhibits no tenderness, no swelling and no deformity.       Thoracic back: He exhibits no tenderness, no swelling and no deformity.       Lumbar back: He exhibits no tenderness and no swelling.       Pelvis stable, no ttp; multiple superficial lacerations between the dermal epidermal layer on the right forearm involving approx 70% of the forearm, no laceration noted left arm,   Neurological: He is alert. He has  normal strength. No sensory deficit. He exhibits normal muscle tone. GCS eye subscore is 4. GCS verbal subscore is 5. GCS motor subscore is 6.       Able to move all extremities, sensation intact throughout  Skin: He is not diaphoretic.  Psychiatric: He has a normal mood and affect. His speech is normal and behavior is normal.    ED Course  Procedures (including critical care time)  Labs Reviewed  PROTIME-INR - Abnormal; Notable for the following:    Prothrombin Time 23.3 (*)     INR 2.03 (*)     All other components within normal limits  POCT I-STAT, CHEM 8 - Abnormal; Notable for the following:    Glucose, Bld 140 (*)     All other components within normal limits   Ct Head Wo Contrast  12/26/2011  *RADIOLOGY REPORT*  Clinical Data: Fall.  Head trauma.  CT HEAD WITHOUT CONTRAST  Technique:  Contiguous axial images were obtained from the base of the skull through the vertex without contrast.  Comparison: 07/16/2007.  Findings: No mass lesion, mass effect, midline shift, hydrocephalus, hemorrhage.  No acute territorial cortical ischemia/infarct. Atrophy and chronic ischemic white matter disease is present.  There is no skull fracture.  Intracranial atherosclerosis is present.  Benign basal ganglia calcifications are present.  IMPRESSION: Atrophy and chronic ischemic white matter disease without acute intracranial abnormality.  Original Report Authenticated By: Andreas Newport, M.D.     No diagnosis found.    MDM  Wound are all superficial.  None would be amenable to suture.  Will provide local wound care, antibiotic ointment and dressing.          Celene Kras, MD 12/26/11 [phone]

## 2011-12-26 NOTE — ED Notes (Signed)
Bed:WHALF<BR> Expected date:<BR> Expected time:<BR> Means of arrival:<BR> Comments:<BR> CLOSED

## 2015-11-29 DIAGNOSIS — D62 Acute posthemorrhagic anemia: Secondary | ICD-10-CM

## 2015-11-29 DIAGNOSIS — I10 Essential (primary) hypertension: Secondary | ICD-10-CM

## 2015-11-29 DIAGNOSIS — T45515A Adverse effect of anticoagulants, initial encounter: Secondary | ICD-10-CM

## 2015-11-29 DIAGNOSIS — S7010XA Contusion of unspecified thigh, initial encounter: Secondary | ICD-10-CM

## 2015-11-29 DIAGNOSIS — W19XXXA Unspecified fall, initial encounter: Secondary | ICD-10-CM

## 2015-11-29 DIAGNOSIS — R262 Difficulty in walking, not elsewhere classified: Secondary | ICD-10-CM

## 2015-11-29 DIAGNOSIS — I4891 Unspecified atrial fibrillation: Secondary | ICD-10-CM

## 2015-11-30 DIAGNOSIS — I10 Essential (primary) hypertension: Secondary | ICD-10-CM

## 2015-11-30 DIAGNOSIS — W19XXXA Unspecified fall, initial encounter: Secondary | ICD-10-CM

## 2015-11-30 DIAGNOSIS — S7010XA Contusion of unspecified thigh, initial encounter: Secondary | ICD-10-CM | POA: Diagnosis not present

## 2015-11-30 DIAGNOSIS — D62 Acute posthemorrhagic anemia: Secondary | ICD-10-CM

## 2015-11-30 DIAGNOSIS — I4891 Unspecified atrial fibrillation: Secondary | ICD-10-CM | POA: Diagnosis not present

## 2015-11-30 DIAGNOSIS — T45515A Adverse effect of anticoagulants, initial encounter: Secondary | ICD-10-CM | POA: Diagnosis not present

## 2015-11-30 DIAGNOSIS — R262 Difficulty in walking, not elsewhere classified: Secondary | ICD-10-CM | POA: Diagnosis not present

## 2015-12-01 DIAGNOSIS — I10 Essential (primary) hypertension: Secondary | ICD-10-CM | POA: Diagnosis not present

## 2015-12-01 DIAGNOSIS — R262 Difficulty in walking, not elsewhere classified: Secondary | ICD-10-CM | POA: Diagnosis not present

## 2015-12-01 DIAGNOSIS — S7010XA Contusion of unspecified thigh, initial encounter: Secondary | ICD-10-CM | POA: Diagnosis not present

## 2015-12-01 DIAGNOSIS — T45515A Adverse effect of anticoagulants, initial encounter: Secondary | ICD-10-CM | POA: Diagnosis not present

## 2015-12-01 DIAGNOSIS — D62 Acute posthemorrhagic anemia: Secondary | ICD-10-CM | POA: Diagnosis not present

## 2015-12-01 DIAGNOSIS — W19XXXA Unspecified fall, initial encounter: Secondary | ICD-10-CM | POA: Diagnosis not present

## 2015-12-01 DIAGNOSIS — I4891 Unspecified atrial fibrillation: Secondary | ICD-10-CM | POA: Diagnosis not present

## 2015-12-02 DIAGNOSIS — S7010XA Contusion of unspecified thigh, initial encounter: Secondary | ICD-10-CM | POA: Diagnosis not present

## 2015-12-02 DIAGNOSIS — D62 Acute posthemorrhagic anemia: Secondary | ICD-10-CM | POA: Diagnosis not present

## 2015-12-02 DIAGNOSIS — I4891 Unspecified atrial fibrillation: Secondary | ICD-10-CM | POA: Diagnosis not present

## 2015-12-02 DIAGNOSIS — T45515A Adverse effect of anticoagulants, initial encounter: Secondary | ICD-10-CM | POA: Diagnosis not present

## 2015-12-02 DIAGNOSIS — I10 Essential (primary) hypertension: Secondary | ICD-10-CM | POA: Diagnosis not present

## 2015-12-02 DIAGNOSIS — R262 Difficulty in walking, not elsewhere classified: Secondary | ICD-10-CM | POA: Diagnosis not present

## 2015-12-02 DIAGNOSIS — W19XXXA Unspecified fall, initial encounter: Secondary | ICD-10-CM | POA: Diagnosis not present

## 2016-01-31 ENCOUNTER — Ambulatory Visit: Payer: Medicare Other | Admitting: Neurology

## 2016-02-13 ENCOUNTER — Ambulatory Visit (INDEPENDENT_AMBULATORY_CARE_PROVIDER_SITE_OTHER): Payer: Medicare Other | Admitting: Neurology

## 2016-02-13 ENCOUNTER — Encounter: Payer: Self-pay | Admitting: Neurology

## 2016-02-13 VITALS — BP 108/76 | HR 80 | Ht 76.0 in

## 2016-02-13 DIAGNOSIS — M5432 Sciatica, left side: Secondary | ICD-10-CM

## 2016-02-13 MED ORDER — CYCLOBENZAPRINE HCL 5 MG PO TABS: 5.0000 mg | ORAL_TABLET | Freq: Three times a day (TID) | ORAL | 1 refills | Status: AC | PRN

## 2016-02-13 MED ORDER — GABAPENTIN 300 MG PO CAPS: 300.0000 mg | ORAL_CAPSULE | Freq: Three times a day (TID) | ORAL | 3 refills | Status: AC

## 2016-02-13 NOTE — Patient Instructions (Addendum)
I had a long discussion the patient and his son regarding his chronic low back pain and left leg sciatica  likely from lumbar radiculopathy from degenerative spine disease. I recommend trial of Flexeril 5 mg every 8 hourly as needed for back spasm as well as gabapentin 300 mg 3 times daily for neuropathic pain. Check EMG nerve conduction study as well as MRI scan of the lumbar spine. Patient likely will need referral to neurosurgeon Dr. Marikay Alar for epidural steroid injections unless he shows significant improvement on the above medications. I also advised him to get up slowly and to use a walker at all times to avoid falls and injuries. He will return for follow-up in 2 months or call earlier if necessary.   Sciatica Sciatica is pain, weakness, numbness, or tingling along the path of the sciatic nerve. The nerve starts in the lower back and runs down the back of each leg. The nerve controls the muscles in the lower leg and in the back of the knee, while also providing sensation to the back of the thigh, lower leg, and the sole of your foot. Sciatica is a symptom of another medical condition. For instance, nerve damage or certain conditions, such as a herniated disk or bone spur on the spine, pinch or put pressure on the sciatic nerve. This causes the pain, weakness, or other sensations normally associated with sciatica. Generally, sciatica only affects one side of the body. CAUSES   Herniated or slipped disc.  Degenerative disk disease.  A pain disorder involving the narrow muscle in the buttocks (piriformis syndrome).  Pelvic injury or fracture.  Pregnancy.  Tumor (rare). SYMPTOMS  Symptoms can vary from mild to very severe. The symptoms usually travel from the low back to the buttocks and down the back of the leg. Symptoms can include:  Mild tingling or dull aches in the lower back, leg, or hip.  Numbness in the back of the calf or sole of the foot.  Burning sensations in the lower  back, leg, or hip.  Sharp pains in the lower back, leg, or hip.  Leg weakness.  Severe back pain inhibiting movement. These symptoms may get worse with coughing, sneezing, laughing, or prolonged sitting or standing. Also, being overweight may worsen symptoms. DIAGNOSIS  Your caregiver will perform a physical exam to look for common symptoms of sciatica. He or she may ask you to do certain movements or activities that would trigger sciatic nerve pain. Other tests may be performed to find the cause of the sciatica. These may include:  Blood tests.  X-rays.  Imaging tests, such as an MRI or CT scan. TREATMENT  Treatment is directed at the cause of the sciatic pain. Sometimes, treatment is not necessary and the pain and discomfort goes away on its own. If treatment is needed, your caregiver may suggest:  Over-the-counter medicines to relieve pain.  Prescription medicines, such as anti-inflammatory medicine, muscle relaxants, or narcotics.  Applying heat or ice to the painful area.  Steroid injections to lessen pain, irritation, and inflammation around the nerve.  Reducing activity during periods of pain.  Exercising and stretching to strengthen your abdomen and improve flexibility of your spine. Your caregiver may suggest losing weight if the extra weight makes the back pain worse.  Physical therapy.  Surgery to eliminate what is pressing or pinching the nerve, such as a bone spur or part of a herniated disk. HOME CARE INSTRUCTIONS   Only take over-the-counter or prescription medicines for pain  or discomfort as directed by your caregiver.  Apply ice to the affected area for 20 minutes, 3-4 times a day for the first 48-72 hours. Then try heat in the same way.  Exercise, stretch, or perform your usual activities if these do not aggravate your pain.  Attend physical therapy sessions as directed by your caregiver.  Keep all follow-up appointments as directed by your  caregiver.  Do not wear high heels or shoes that do not provide proper support.  Check your mattress to see if it is too soft. A firm mattress may lessen your pain and discomfort. SEEK IMMEDIATE MEDICAL CARE IF:   You lose control of your bowel or bladder (incontinence).  You have increasing weakness in the lower back, pelvis, buttocks, or legs.  You have redness or swelling of your back.  You have a burning sensation when you urinate.  You have pain that gets worse when you lie down or awakens you at night.  Your pain is worse than you have experienced in the past.  Your pain is lasting longer than 4 weeks.  You are suddenly losing weight without reason. MAKE SURE YOU:  Understand these instructions.  Will watch your condition.  Will get help right away if you are not doing well or get worse.   This information is not intended to replace advice given to you by your health care provider. Make sure you discuss any questions you have with your health care provider.   Document Released: 05/29/2001 Document Revised: 02/23/2015 Document Reviewed: 10/14/2011 Elsevier Interactive Patient Education [employer]2016 Elsevier Inc.

## 2016-02-13 NOTE — Progress Notes (Signed)
[facility] [address] [place]. [place] [id number] [phone]       OFFICE CONSULT NOTE  [name] Date of Birth:  1924/06/15 Medical Record Number:  [id number]   Referring MD:  Lucila Maineobert Scott Reason for Referral:  Back and left leg pain  HPI: [age] year Caucasian male who's had chronic low back and left leg pain for last 2-3 years. He is accompanied today by his son who complements his history. Patient has long-standing history of back pain and in fact underwent lumbar spine surgery by Dr. Marikay Alaravid Jones several years ago. He is unable to recall details but review of imaging studies available in his electronic medical records suggest the surgery happened in January 2010. And was L 3-L5 PLIF. He states the surgery really didn't help him a lot. For the last 23 eosinophils and chronic low back pain which he describes as a muscle pulling sensation and tightness. Intermittently he also has sharp shooting pain going down the lateral aspect of the left thigh up to the knee. He is also had some numbness in his left leg. He has also had several falls in the last 1 year and initially was living in independent living facility but had to move to assisted living facility to the point now that he requires 2 person assist to stand and walk. Is there to walk with a walker but states he cannot walk long because of back pain and neck pain. The patient was admitted to [facility] a month ago following a fall when he developed a retroperitoneal bleed while on eliquis for atrial fibrillation. I do not have access to those records but apparently eliquis has been discontinued. Patient was seen by his primary physician a month ago for severe back pain and left leg pain and was given a short course of prednisone which helped his pain significantly. Patient also had x-rays done at [facility] which apparently showed worsening of arthritic changes but no do not have access to those. The  patient describes both her intermittent sharp shooting pain going down the lateral aspect of his leg which is brought on by position and movement. He is currently taking Vicodin which helps his back pain but not the shooting pain. He has not tried medications like Lyrica, gabapentin, Topamax. He has not had any recent of conduction studies or MRI scans of his back. He denies any lack of bladder control. Review office available imaging studies show x-ray of the lumbar spine done on 02/07/2009 which shows stable appearance status post L3-L5 PLIF.  Anatomic alignment.Degenerative disc disease and spondylosis at T12-L1 and L1-2, unchanged.  No new/acute abnormalities. CT scan of the head dated 12/26/11 shows mild atrophy and chronic ischemic white matter disease without acute intracranial abnormality  ROS:   14 system review of systems is positive for  back pain, hip pain, neck pain, sciatica, gait difficulty, hearing loss and all other systems negative  PMH:  Past Medical History:  Diagnosis Date  . Atrial fibrillation (HCC)   . Coronary artery disease   . Depression   . Hard of hearing   . High cholesterol   . Hypertension   . Kidney stone   . Prostate disorder   . Renal disorder   . Spinal stenosis   . Thyroid disease     Social History:  Social History   Social History  . Marital status: Widowed    Spouse name: N/A  . Number of children: N/A  .  Years of education: N/A   Occupational History  . Not on file.   Social History Main Topics  . Smoking status: Never Smoker  . Smokeless tobacco: Never Used  . Alcohol use No  . Drug use: No  . Sexual activity: Not on file   Other Topics Concern  . Not on file   Social History Narrative  . No narrative on file    Medications:   Current Outpatient Prescriptions on File Prior to Visit  Medication Sig Dispense Refill  . aspirin EC 81 MG tablet Take 81 mg by mouth daily.    . bethanechol (URECHOLINE) 10 MG tablet Take 10 mg by  mouth 2 (two) times daily.    Marland Kitchen escitalopram (LEXAPRO) 10 MG tablet Take 20 mg by mouth daily.     . finasteride (PROSCAR) 5 MG tablet Take 5 mg by mouth daily.    . Lactobacillus (ACIDOPHILUS PO) Take 460 mg by mouth daily.    Marland Kitchen levothyroxine (SYNTHROID, LEVOTHROID) 125 MCG tablet Take 150 mcg by mouth daily.     Marland Kitchen lisinopril (PRINIVIL,ZESTRIL) 10 MG tablet Take 10 mg by mouth daily.    . pravastatin (PRAVACHOL) 20 MG tablet Take 20 mg by mouth daily.    . Tamsulosin HCl (FLOMAX) 0.4 MG CAPS Take 0.4 mg by mouth daily.     No current facility-administered medications on file prior to visit.     Allergies:   Allergies  Allergen Reactions  . Meloxicam     Lip swelling   . Penicillins Other (See Comments)    LIPS SWELLING   . Sulfa Antibiotics Other (See Comments)    LIPS SWELLING     Physical Exam General: well developed, well nourished elderly Caucasian male, seated, in no evident distress Head: head normocephalic and atraumatic.   Neck: supple with no carotid or supraclavicular bruits Cardiovascular: regular rate and rhythm, no murmurs Musculoskeletal: no deformity. Mild back spasm paraspinally left more than right Skin:  no rash/petichiae Vascular:  Normal pulses all extremities  Neurologic Exam Mental Status: Awake and fully alert. Oriented to place and time. Recent and remote memory intact. Attention span, concentration and fund of knowledge appropriate. Mood and affect appropriate.  Cranial Nerves: Fundoscopic exam reveals sharp disc margins. Pupils equal, briskly reactive to light. Extraocular movements full without nystagmus. Visual fields full to confrontation. Hearing diminished bilaterally.. Facial sensation intact. Face, tongue, palate moves normally and symmetrically.  Motor: Normal bulk and tone. Normal strength in all tested extremity muscles Except left foot drop with weakness of ankle dorsiflexors and everters. Straight leg raising test is positive on the left and  limited to 20.. Sensory.: intact to touch , pinprick , position and vibratory sensation except in the left leg from knee down when he has diminished sensation to touch and pinprick.  Coordination: Rapid alternating movements normal in all extremities. Finger-to-nose and heel-to-shin performed accurately bilaterally. Gait and Station: Arises from chair with  Difficulty and to person assist. Gait is antalgic with left foot drop and favoring his back  Reflexes: 1+ and symmetric Except knee jerks are depressed bilaterally. Toes downgoing.      ASSESSMENT: 66 year Caucasian male with history of chronic low back pain and left leg shy take a with recent exacerbation of symptoms following a fall with back injury and retroperitoneal hematoma on anticoagulation. Clinical exams suggest left foot drop with L5-S1 radiculopathy    PLAN: I had a long discussion the patient and his son regarding his chronic low  back pain and left leg sciatica  likely from lumbar radiculopathy from degenerative spine disease. I recommend trial of Flexeril 5 mg every 8 hourly as needed for back spasm as well as gabapentin 300 mg 3 times daily for neuropathic pain. Check EMG nerve conduction study as well as MRI scan of the lumbar spine. Patient likely will need referral to neurosurgeon Dr. Marikay Alar for epidural steroid injections unless he shows significant improvement on the above medications. I also advised him to get up slowly and to use a walker at all times to avoid falls and injuries. Greater than 50% time during this 45 minute consultation was spent on counseling and coordination of care about back pain, sciatica He will return for follow-up in 2 months or call earlier if necessary. Delia Heady, MD  [facility] [address] [place], [place] [address]  Phone [phone] Fax [phone] Note: This document was prepared with digital dictation and possible smart phrase technology. Any  transcriptional errors that result from this process are unintentional.

## 2016-02-23 ENCOUNTER — Telehealth: Payer: Self-pay | Admitting: Neurology

## 2016-02-23 NOTE — Telephone Encounter (Signed)
Son Thayer Ohm returned Danielle's call, please call [phone].

## 2016-02-28 NOTE — Telephone Encounter (Signed)
Pt's son Thayer Ohm [phone] request call

## 2016-03-02 ENCOUNTER — Telehealth: Payer: Self-pay | Admitting: Neurology

## 2016-03-02 DIAGNOSIS — M5432 Sciatica, left side: Secondary | ICD-10-CM

## 2016-03-02 NOTE — Telephone Encounter (Signed)
MRI report fax to [facility] at [phone]

## 2016-03-02 NOTE — Telephone Encounter (Signed)
Lab and creatinine orders fax to [facility] at [phone]. Pt schedule for a test order by Dr.Sethi.

## 2016-03-02 NOTE — Telephone Encounter (Signed)
Rn call [phone]. Rn spoke with the radiology department about which labs were needed for patient. Rn stated only BUN,and Creatine are needed. The lab orders can be fax to [place] at [phone].

## 2016-03-02 NOTE — Telephone Encounter (Signed)
Returned Kirk Miller's call he requested [facility].

## 2016-03-02 NOTE — Telephone Encounter (Signed)
This patient will need an order for labs sent to [facility].

## 2016-03-15 ENCOUNTER — Encounter: Payer: Medicare Other | Admitting: Diagnostic Neuroimaging

## 2016-04-05 ENCOUNTER — Ambulatory Visit (INDEPENDENT_AMBULATORY_CARE_PROVIDER_SITE_OTHER): Payer: Medicare Other | Admitting: Diagnostic Neuroimaging

## 2016-04-05 ENCOUNTER — Encounter (INDEPENDENT_AMBULATORY_CARE_PROVIDER_SITE_OTHER): Payer: Self-pay | Admitting: Diagnostic Neuroimaging

## 2016-04-05 DIAGNOSIS — M5432 Sciatica, left side: Secondary | ICD-10-CM | POA: Diagnosis not present

## 2016-04-05 DIAGNOSIS — Z0289 Encounter for other administrative examinations: Secondary | ICD-10-CM

## 2016-04-05 NOTE — Procedures (Signed)
   [facility]  NCS (NERVE CONDUCTION STUDY) WITH EMG (ELECTROMYOGRAPHY) REPORT   STUDY DATE: 04/05/16 PATIENT NAME: Kirk Miller DOB: 07-31-1923 MRN: [id number]  ORDERING CLINICIAN: Delia Heady, MD   TECHNOLOGIST: Gearldine Shown  ELECTROMYOGRAPHER: Glenford Bayley. Shadell Brenn, MD  CLINICAL INFORMATION: 82 -year-old male with history of lumbar spine surgery, chronic low back pain, here for evaluation of low back pain radiating to left leg for past 1 year. Patient reports intermittent numbness and tingling in bilateral hands and bilateral feet. Left side is more affected than the right.   FINDINGS: NERVE CONDUCTION STUDY: Left median motor response has prolonged distal latency, normal amplitude, normal conduction velocity and normal F-wave latency.  Left ulnar motor response has normal distal latency, decreased amplitude, normal conduction velocity and normal F-wave latency.  Bilateral peroneal motor responses could not be obtained.  Bilateral tibial motor responses have prolonged distal latencies, decreased amplitudes, normal conduction velocities and absent F wave latencies.  Bilateral H reflex responses could not be obtained.  Left median sensory response has prolonged latency and decreased amplitude.  Left ulnar sensory response has normal distal latency and decreased amplitude.  Bilateral sural and superficial peroneal sensory responses could not be obtained.    NEEDLE ELECTROMYOGRAPHY: Needle examination of selected muscles of left lower extremity vastus medialis, tibialis anterior, gastrocnemius demonstrates 1+ positive sharp waves and fibrillation potentials in the left tibialis anterior and left gastrocnemius muscles at rest. Decreased motor unit recruitment in the left tibialis anterior on exertion. Left vastus medialis and gastric image muscles have normal motor unit recruitment. Lumbar paraspinal muscles were deferred due to history of lumbar spine  surgery.   IMPRESSION:  Abnormal study demonstrating: 1. 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 2. Evidence for underlying bilateral lumbar radiculopathies. Correlate clinically and with neuroimaging findings.     INTERPRETING PHYSICIAN:  Suanne Marker, MD Certified in Neurology, Neurophysiology and Neuroimaging  [facility] [address], [address] [place], [place] [address] [phone]

## 2016-04-09 DIAGNOSIS — I4891 Unspecified atrial fibrillation: Secondary | ICD-10-CM | POA: Diagnosis not present

## 2016-04-09 DIAGNOSIS — I10 Essential (primary) hypertension: Secondary | ICD-10-CM | POA: Diagnosis not present

## 2016-04-09 DIAGNOSIS — I501 Left ventricular failure: Secondary | ICD-10-CM

## 2016-04-09 DIAGNOSIS — I350 Nonrheumatic aortic (valve) stenosis: Secondary | ICD-10-CM

## 2016-04-10 DIAGNOSIS — I4891 Unspecified atrial fibrillation: Secondary | ICD-10-CM | POA: Diagnosis not present

## 2016-04-10 DIAGNOSIS — I501 Left ventricular failure: Secondary | ICD-10-CM | POA: Diagnosis not present

## 2016-04-10 DIAGNOSIS — I10 Essential (primary) hypertension: Secondary | ICD-10-CM | POA: Diagnosis not present

## 2016-04-10 DIAGNOSIS — I350 Nonrheumatic aortic (valve) stenosis: Secondary | ICD-10-CM | POA: Diagnosis not present

## 2016-04-11 DIAGNOSIS — I2510 Atherosclerotic heart disease of native coronary artery without angina pectoris: Secondary | ICD-10-CM | POA: Diagnosis not present

## 2016-04-11 DIAGNOSIS — I350 Nonrheumatic aortic (valve) stenosis: Secondary | ICD-10-CM

## 2016-04-11 DIAGNOSIS — I509 Heart failure, unspecified: Secondary | ICD-10-CM | POA: Diagnosis not present

## 2016-04-11 DIAGNOSIS — T45515A Adverse effect of anticoagulants, initial encounter: Secondary | ICD-10-CM

## 2016-04-11 DIAGNOSIS — I4891 Unspecified atrial fibrillation: Secondary | ICD-10-CM

## 2016-04-12 DIAGNOSIS — I509 Heart failure, unspecified: Secondary | ICD-10-CM | POA: Diagnosis not present

## 2016-04-12 DIAGNOSIS — I350 Nonrheumatic aortic (valve) stenosis: Secondary | ICD-10-CM | POA: Diagnosis not present

## 2016-04-12 DIAGNOSIS — I4891 Unspecified atrial fibrillation: Secondary | ICD-10-CM | POA: Diagnosis not present

## 2016-04-12 DIAGNOSIS — I2510 Atherosclerotic heart disease of native coronary artery without angina pectoris: Secondary | ICD-10-CM | POA: Diagnosis not present

## 2016-04-12 DIAGNOSIS — T45515A Adverse effect of anticoagulants, initial encounter: Secondary | ICD-10-CM | POA: Diagnosis not present

## 2016-04-18 DEATH — deceased

## 2016-04-25 ENCOUNTER — Ambulatory Visit: Payer: Medicare Other | Admitting: Neurology

## 2016-05-22 ENCOUNTER — Ambulatory Visit: Payer: Medicare Other | Admitting: Neurology
